# Patient Record
Sex: Female | Born: 1985 | Race: Black or African American | Hispanic: No | Marital: Married | Smoking: Never smoker
Health system: Southern US, Community
[De-identification: ages and names within clinical notes are randomized; demographics above are authoritative.]

## PROBLEM LIST (undated history)

## (undated) DIAGNOSIS — K219 Gastro-esophageal reflux disease without esophagitis: Secondary | ICD-10-CM

## (undated) DIAGNOSIS — G43909 Migraine, unspecified, not intractable, without status migrainosus: Secondary | ICD-10-CM

## (undated) DIAGNOSIS — N83201 Unspecified ovarian cyst, right side: Secondary | ICD-10-CM

## (undated) HISTORY — DX: Migraine, unspecified, not intractable, without status migrainosus: G43.909

## (undated) HISTORY — DX: Unspecified ovarian cyst, right side: N83.201

## (undated) HISTORY — DX: Gastro-esophageal reflux disease without esophagitis: K21.9

---

## 2014-07-07 ENCOUNTER — Ambulatory Visit: Admit: 2014-07-07 | Discharge: 2014-07-07 | Payer: Commercial Managed Care - PPO | Attending: MD

## 2014-07-07 DIAGNOSIS — R21 Rash and other nonspecific skin eruption: Secondary | ICD-10-CM

## 2014-07-07 MED ORDER — triamcinolone (KENALOG) 0.1 % cream
0.1 | Freq: Two times a day (BID) | TOPICAL | 0.00 refills | 30.00000 days | Status: AC
Start: 2014-07-07 — End: 2014-07-21

## 2014-07-07 NOTE — Progress Notes (Signed)
Regina Perez is a 29 y.o. female who presents with complaint of Establish Care      HISTORY OF PRESENT ILLNESS   HPI    Regina Perez is a pleasant 29 year old new patient to this clinic who presents to establish primary care. She has no chronic medical problems and is otherwise healthy.  She presents with complaints of abnormal rash. She first noticed this about 4 weeks ago. The rash is located on the back of her neck and it was noticed by her sister who was helping her dye her hair. The rash is brownish in color and feels rough. She denies any itching, redness, draining or pain. She has not had similar symptoms in the past and denies similar rash in other areas. She does not recall any contacts with similar rash or exposure to new detergents or soaps, insect bites, new medication or plants contacts. She has not tried any treatment for this condition to date.    [name] is not currently on any form of contraception. She said that in the past, she had been on Depo-Provera. This was about 4 years ago but she had to discontinue her birth control because it was not completely covered by her insurance at that time. She is not currently sexually active but has considered other forms of contraception. She has no known contraindications for birth control.    Regina Perez is single and works at the [employer]. She moved from [place] city, [address] to attend [employer] in [place]. She decided to remain here after she graduated and now and has a permanent job. She drinks alcohol rarely and is a nonsmoker.      Health Maintenance  Diabetes Mellitus: Recommended  Hyperlipidemia: Recommended   Pap Smear: Recommended  Mammogram: Not Applicable  Colorectal Cancer Screening: Not Applicable  Dexa Scan: Not Applicable  Influenza Vaccine: Not Applicable  Pneumococcal Polysaccharide Vaccine: Not Applicable  Zoster Vaccine: Not Applicable  Tetanus: Up to Date        No problems updated.  History reviewed.  No pertinent past medical history.  History reviewed. No pertinent past surgical history.  No Known Allergies  History     Social History Main Topics   . Smoking status: Passive Smoke Exposure - Never Smoker   . Smokeless tobacco: Never Used   . Alcohol Use: Yes      Comment: rarely   . Drug Use: No   . Sexual Activity: Not Currently     Birth Control/ Protection: None     Social History   . Marital Status: Single     Spouse Name: N/A   . Number of Children: N/A   . Years of Education: N/A     Other Topics Concern   . Caffeine Concern No   . Exercise Yes     gym       No results found for any previous visit.    REVIEW OF SYSTEMS   Review of Systems   Constitutional: Negative.    HENT: Negative.    Eyes: Negative.    Respiratory: Negative.    Cardiovascular: Negative.    Gastrointestinal: Negative.    Genitourinary: Negative.    Musculoskeletal: Negative.    Skin: Positive for rash.   Neurological: Negative.    Hematological: Negative.    Psychiatric/Behavioral: Negative.        PHYSICAL EXAM   BP 122/70 mmHg  Pulse 88  Temp(Src) 36.9 ?C (98.4 ?F) (Oral)  Ht 5' 8 (1.727  m)  Wt 274 lb 14.4 oz (124.694 kg)  BMI 41.81 kg/m2  SpO2 98%  Body mass index is 41.81 kg/(m^2).  No exam data present    Physical Exam   Constitutional: She is oriented to person, place, and time. She appears well-developed and well-nourished. No distress.   Cardiovascular: Normal rate.    Pulmonary/Chest: Effort normal. No respiratory distress.   Neurological: She is alert and oriented to person, place, and time.   Skin: She is not diaphoretic.   Rough, brown macules on back of neck   Psychiatric: She has a normal mood and affect. Her behavior is normal. Judgment and thought content normal.   Nursing note and vitals reviewed.        ASSESSMENT & PLAN   Bisma was seen today for establish care.    Diagnoses and all orders for this visit:    Localized macular rash: Differentials include contact dermatitis, tinea versicolor and seborrheic  dermatitis. Recommend trial of triamcinolone cream apply twice daily for 14 days.  Orders:  -     triamcinolone (KENALOG) 0.1 % cream; Apply topically 2 (two) times daily for 14 days.    Encounter for other general counseling or advice on contraception: Counseling and education provided today regarding the different forms of contraception. Risks and benefits of combined OCPs, intrauterine devices (ParaGard, [employer]) as well as intradermal implants like Nexplanon discussed. She was given opportunities to ask questions which were answered in detail. Educational materials were also provided to patient at today's visit.       Passive smoke exposure        This note was transcribed using speech recognition software. Please contact us for clarification if any questions arise relating to the wording of this document.      [facility]

## 2014-11-03 DIAGNOSIS — J029 Acute pharyngitis, unspecified: Secondary | ICD-10-CM

## 2014-11-03 NOTE — Discharge Instructions (Signed)
Increase oral fluid intake  Warm fluids, cold fluids, zinc lozenges, cepachol  Return for shortness of breath, inability to swallow, voice changes, new concerns  Tylenol, ibuprofen as directed  Primary care follow up in 3-5 days    Pharyngitis  Pharyngitis is redness, pain, and swelling (inflammation) of your pharynx.   CAUSES   Pharyngitis is usually caused by infection. Most of the time, these infections are from viruses (viral) and are part of a cold. However, sometimes pharyngitis is caused by bacteria (bacterial). Pharyngitis can also be caused by allergies. Viral pharyngitis may be spread from person to person by coughing, sneezing, and personal items or utensils (cups, forks, spoons, toothbrushes). Bacterial pharyngitis may be spread from person to person by more intimate contact, such as kissing.   SIGNS AND SYMPTOMS   Symptoms of pharyngitis include: ?  ? Sore throat. ?  ? Tiredness (fatigue). ?  ? Low-grade fever. ?  ? Headache.  ? Joint pain and muscle aches.  ? Skin rashes.  ? Swollen lymph nodes.  ? Plaque-like film on throat or tonsils (often seen with bacterial pharyngitis).  DIAGNOSIS   Your health care provider will ask you questions about your illness and your symptoms. Your medical history, along with a physical exam, is often all that is needed to diagnose pharyngitis. Sometimes, a rapid strep test is done. Other lab tests may also be done, depending on the suspected cause.   TREATMENT   Viral pharyngitis will usually get better in 3-4 days without the use of medicine. Bacterial pharyngitis is treated with medicines that kill germs (antibiotics).   HOME CARE INSTRUCTIONS   ? Drink enough water and fluids to keep your urine clear or pale yellow. ?  ? Only take over-the-counter or prescription medicines as directed by your health care provider: ?  ? If you are prescribed antibiotics, make sure you finish them even if you start to feel better. ?  ? Do not take aspirin. ?  ? Get lots of rest.  ?  ? Gargle with 8 oz of salt water (? tsp of salt per 1 qt of water) as often as every 1-2 hours to soothe your throat. ?  ? Throat lozenges (if you are not at risk for choking) or sprays may be used to soothe your throat.  SEEK MEDICAL CARE IF:   ? You have large, tender lumps in your neck.  ? You have a rash.  ? You cough up green, yellow-brown, or bloody spit.  SEEK IMMEDIATE MEDICAL CARE IF:   ? Your neck becomes stiff.  ? You drool or are unable to swallow liquids.  ? You vomit or are unable to keep medicines or liquids down.  ? You have severe pain that does not go away with the use of recommended medicines.  ? You have trouble breathing (not caused by a stuffy nose).  MAKE SURE YOU:   ? Understand these instructions.  ? Will watch your condition.  ? Will get help right away if you are not doing well or get worse.  Document Released: 02/20/2005 Document Revised: 12/11/2012 Document Reviewed: 10/28/2012  ExitCare? Patient Information ?2015 [employer]. This information is not intended to replace advice given to you by your health care provider. Make sure you discuss any questions you have with your health care provider.    Salt Water Gargle  This solution will help make your mouth and throat feel better.  HOME CARE INSTRUCTIONS   ? Mix 1 teaspoon  of salt in 8 ounces of warm water.  ? Gargle with this solution as much or often as you need or as directed. Swish and gargle gently if you have any sores or wounds in your mouth.  ? Do not swallow this mixture.  Document Released: 11/25/2003 Document Revised: 05/15/2011 Document Reviewed: 04/17/2008  ExitCare? Patient Information ?2015 [employer]. This information is not intended to replace advice given to you by your health care provider. Make sure you discuss any questions you have with your health care provider.

## 2014-11-03 NOTE — ED Provider Notes (Signed)
I saw this patient in my role as the ED provider at triage.  As such I did limited history and physical exam to confirm the patient's triage level and order appropriate studies (if any) to begin the patient's workup. The patient understands that a complete evaluation will be done when they are brought back to an ED room.      Chief complaint: sore throat since Sunday, ear pain bilaterally  Denies fevers    Pertinent findings: none    Plan: rapid strep test      Tillie Rung, NP  11/03/14 1739

## 2014-11-03 NOTE — ED Provider Notes (Signed)
[facility] EMERGENCY DEPARTMENT ENCOUNTER    History and Physical     Name: Regina Perez  DOB: [birth year] 30 y.o.  MRN: [id number]  CSN: [id number]    HISTORY:     CC:   Otalgia and Sore Throat    Chief Complaint   Patient presents with   . Otalgia   . Sore Throat       HPI:   Regina Perez is a 29 y.o. female who presents to the ED for evaluation of sore throat that began on Sunday. She reports painful swallowing. No significant nasal congestion and drainage. No cough or abdominal pain or rash. No fever or body aches. Ear pain is actually not within the ear but just below it. Went to work today, no treatments tried. No ill contacts, no recent travel. She does report she was around children which is not a common occurrence.    PMH:   She  has no past medical history on file.    PSH:   She  has no past surgical history on file.     Medications:   Previous Medications    No medications on file       Allergies:   She has No Known Allergies.    Social History:    She  reports that she has been passively smoking.  She has never used smokeless tobacco. She  reports that she drinks alcohol. She  reports that she does not use illicit drugs.   She denies the possibility of pregnancy    Review of Systems:   As in HPI otherwise complete review of systems negative except for those listed above.      Physical Exam   VITAL SIGNS: Blood pressure 157/95, pulse 85, temperature 37 ?C (98.6 ?F), temperature source Oral, resp. rate 12, weight 117.935 kg (260 lb), last menstrual period 10/20/2014, SpO2 98 %.      Constitutional: No acute distress. Non-toxic appearance.    HEENT: Normocephalic. Atraumatic. Oropharynx is clear with moist mucous membranes. Pupils equal, extra occular movement intact. Posterior pharynx is injected. Uvula is midline and there is no pharyngeal asymmetry. No peritonsillar abscesses noted. No tonsillar exudates. Bilateral TMs gray with good landmark visualization. She does have tender anterior cervical  lymphadenopathy bilaterally.    Neck:  Supple    Chest: Lungs clear to auscultation without wheezes, rales or rhonchi.    Cardiovascular: Regular rate and rhythm without murmurs, rubs or gallops.    Abdomen: Bowel sounds normal, Soft, No tenderness, No masses, No pulsatile masses. No hepatosplenomegaly. No rigidity or guarding    Skin: Warm and dry. No erythema. No rash.    Extremities: Ambulatory    Neurologic: Alert & oriented    ED Course and Medical Decision Making:    Regina Perez presented to the ED for evaluation.  Nursing notes reviewed.  Based on her history and exam the differential diagnosis includes viral pharyngitis, strep pharyngitis, peritonsillar abscess, soft tissue abscess. There is no signs of cellulitis. Rapid strep is negative. There is no peritonsillar abscess visualized. Discussed symptomatically treatment, signs and symptoms which to return immediately to the ER. Recommend primary care follow-up in 3-5 days. She verbalizes understanding as home in no acute distress    Labs  Results for orders placed or performed during the hospital encounter of 11/03/14   Quick Strep Test (Group A) -STAT   Result Value Ref Range    Source THROAT     Comments to Micro NONE  Rapid Strep A Screen NEGATIVE FOR GROUP A BETA STREP BY RAPID SCREENING     Report Status PENDING        Labs Reviewed   RAPID STREP SCREEN       CLINICAL IMPRESSION:    SNOMED CT(R)   1. Pharyngitis  PHARYNGITIS         PLAN:  There are no discharge medications for this patient.    Marcell Barlow, MD  [address]  [address]  [place] [address]  [phone]    In 3 days          Elnora Morrison, NP  11/03/14 2330

## 2014-11-03 NOTE — ED Notes (Signed)
NP at bedside.

## 2014-11-03 NOTE — ED Notes (Addendum)
PT STATES SHE HAS THE 'WORSE SORE THROAT EVER.'  SINCE Sunday THE 8/28... 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 WORSE SINCE....  TROUBLE SWALLOWING TODAY.. NO FEVER, NO VOMITING.Marland Kitchen PAIN MOVNG TO BILATERAL EARS

## 2014-11-04 ENCOUNTER — Inpatient Hospital Stay
Admit: 2014-11-04 | Discharge: 2014-11-04 | Disposition: A | Discharge status: Home / Self Care | Payer: Commercial Managed Care - PPO | Attending: Family

## 2014-11-04 LAB — RAPID STREP SCREEN: Rapid Strep A Screen: NEGATIVE

## 2015-01-09 NOTE — ED Provider Notes (Signed)
Emergency Provider Note   01/09/2015       History   CC: Otalgia    HPI: Regina Perez is a 29 y.o. female who presents to the ED for evaluation of ear pain.  She has had severe left ear pain for the past 2 days, seen yesterday, started on oral amoxicillin, taking ibuprofen at home without relief.  Pain remains very severe, left ear, interfering with sleep.  No nausea, vomiting, fevers, or other systemic symptoms.    PMH:  No past medical history on file.  PSH:  No past surgical history on file.    Medications:   Previous Medications    AMOXICILLIN (AMOXIL) 500 MG CAPSULE    Take 1 capsule by mouth 2 times daily for 7 days.    BENZONATATE (TESSALON) 100 MG CAPSULE    Take 1 capsule by mouth 3 times daily as needed for Cough.    PSEUDOEPHEDRINE-APAP-DM (DAYQUIL PO)    Take  by mouth.     Allergies: She has No Known Allergies..     Social History: She  reports that she has never smoked. She does not have any smokeless tobacco history on file. She reports that she does not drink alcohol or use illicit drugs..     Review of Systems   Constitutional: Negative for fever and chills.   HENT: Positive for ear pain.    Respiratory: Negative for cough and shortness of breath.    Cardiovascular: Negative for chest pain, palpitations and leg swelling.   Gastrointestinal: Negative for nausea, vomiting and abdominal pain.   Musculoskeletal: Negative for back pain, neck pain and neck stiffness.   Skin: Negative for wound.   Neurological: Negative for dizziness, syncope and weakness.   Hematological: Does not bruise/bleed easily.   Psychiatric/Behavioral: Negative for confusion and dysphoric mood.       Physical Exam   Vital Signs: Temp: 37.7 ?C (99.9 ?F) Pulse: 94 Resp: 16 BP: (!) 152/94 mmHg SpO2: 98 %     Physical Exam   Constitutional: She is oriented to person, place, and time. She appears well-developed and well-nourished. No distress.   HENT:   Head: Normocephalic and atraumatic.   Right Ear: Tympanic membrane and  external ear normal. No swelling or tenderness.   Left Ear: External ear normal. There is swelling (of canal) and tenderness. Tympanic membrane is injected. Tympanic membrane is not perforated.   Nose: Nose normal.   Mouth/Throat: Oropharynx is clear and moist.   Neck: Normal range of motion.   Cardiovascular: Normal rate, regular rhythm and intact distal pulses.    Pulmonary/Chest: Effort normal. No respiratory distress.   Neurological: She is alert and oriented to person, place, and time.   Skin: Skin is warm and dry. No rash noted. She is not diaphoretic. No pallor.   Psychiatric: Her behavior is normal. Thought content normal.   Nursing note and vitals reviewed.      ED Course and Medical Decision Making   [name] presented to the Emergency Department for evaluation, and she was triaged to room ER02. I reviewed the nursing notes, and she was evaluated by me.      Patient presents for evaluation of ongoing ear pain.  Exam reveals otitis externa with no sign of perforation.  She'll be started on Cipro drops and will use topical lidocaine for relief.  No sign of systemic symptoms, she is otherwise well-appearing.  Follow-up primary care.    Impression:  1. Acute otitis externa  Irma Newness, MD  01/09/15 1020

## 2020-04-27 DIAGNOSIS — O0990 Supervision of high risk pregnancy, unspecified, unspecified trimester: Secondary | ICD-10-CM | POA: Insufficient documentation

## 2020-05-21 LAB — OB RESULTS CONSOLE RUBELLA ANTIBODY, IGM: Rubella: IMMUNE

## 2020-05-21 LAB — OB RESULTS CONSOLE RPR: RPR: NONREACTIVE

## 2020-05-21 LAB — OB RESULTS CONSOLE HIV ANTIBODY (ROUTINE TESTING): HIV: NONREACTIVE

## 2020-05-21 LAB — OB RESULTS CONSOLE VARICELLA ZOSTER ANTIBODY, IGG: Varicella: IMMUNE

## 2020-05-21 LAB — OB RESULTS CONSOLE HEPATITIS B SURFACE ANTIGEN: Hepatitis B Surface Ag: NEGATIVE

## 2020-06-14 ENCOUNTER — Other Ambulatory Visit: Payer: Self-pay

## 2020-06-14 ENCOUNTER — Ambulatory Visit: Payer: 59 | Attending: Obstetrics and Gynecology | Admitting: Physical Therapy

## 2020-06-14 ENCOUNTER — Encounter: Payer: Self-pay | Admitting: Physical Therapy

## 2020-06-14 DIAGNOSIS — R102 Pelvic and perineal pain: Secondary | ICD-10-CM | POA: Diagnosis present

## 2020-06-14 DIAGNOSIS — M62838 Other muscle spasm: Secondary | ICD-10-CM | POA: Diagnosis present

## 2020-06-14 DIAGNOSIS — R278 Other lack of coordination: Secondary | ICD-10-CM | POA: Diagnosis present

## 2020-06-14 NOTE — Therapy (Signed)
[facility] [address] [place], [place], [address] Phone: [phone]   Fax:  [phone]  Physical Therapy Evaluation  Patient Details  Name: Yvette Oliver MRN: [id number] Date of Birth: 1985/11/02 Referring Provider (PT): Hassan Buckler   Encounter Date: 06/14/2020   PT End of Session - 06/14/20 0924    Visit Number 1    Number of Visits 12    Date for PT Re-Evaluation 09/06/20    PT Start Time 0930    PT Stop Time 1015    PT Time Calculation (min) 45 min    Activity Tolerance Patient tolerated treatment well    Behavior During Therapy [facility] for tasks assessed/performed           Past Medical History:  Diagnosis Date  . GERD (gastroesophageal reflux disease)   . Migraines   . Right ovarian cyst     History reviewed. No pertinent surgical history.  There were no vitals filed for this visit.        [facility] PT Assessment - 06/14/20 3536      Assessment   Medical Diagnosis Vaginismus    Referring Provider (PT) McVey, Ronita Hipps Dominance Right    Next MD Visit 06/25/2020    Prior Therapy None for this dx      Balance Screen   Has the patient fallen in the past 6 months No            PELVIC HEALTH PHYSICAL THERAPY EVALUATION  SCREENING Red Flags: None Have you had any night sweats? Unexplained weight loss? Saddle anesthesia? Unexplained changes in bowel or bladder habits?  Precautions: [redacted] weeks pregnant  SUBJECTIVE  Chief Complaint: Patient notes issue with tolerating pelvic exams. Patient notes this does not translate to sexual dysfunction; patient does note some discomfort with initial penetration. Patient notes it is difficult to describe pelvic exam pain; patient notes the sensation is that of not stretching. She adds that she will also feel pain in inner thigh muscles and as if she cannot relax either the inner thigh muscles or the PFM.  Patient also notes lower back pain with increased  duration of standing (1-2 hours) with pain radiating to hips. Patient notes pain with increased duration of walking, but standing is worse. Patient also notes tight hamstrings.  Pertinent History:  Falls Negative.  Scoliosis Negative. Pulmonary disease/dysfunction Negative. Surgical history: Negative.    Obstetrical History: G1P0 Deliveries: n/a Tearing/Episiotomy: n/a Birthing position: n/a  Gynecological History: Hysterectomy: No  Endometriosis: Negative PCOS: Positive. Pelvic Organ Prolapse: Negative Last Menstrual Period:  Pain with exam: Yes Heaviness/pressure: Yes    Urinary History: Incontinence: Negative.  Fluid Intake: 3 x16.9 oz H20, rare caffeinated, occasional juices/sodas Nocturia: 0-1x/night Frequency of urination: every 2 hours; historically every 5-6 hours Toileting posture: feet tucked back, heels raised Pain with urination: Negative Difficulty initiating urination: Negative Intermittent stream: Positive Frequent UTI: Negative.   Gastrointestinal History: [place] Stool Chart: Type 3-4 Frequency of BMs: 1x/day Pain with defecation: Negative Straining with defecation: Positive for during pregnancy. Hemorrhoids: Negative  Toileting posture: educated on elevating feet to improve PFM relaxation  Sexual activity/pain: Pain with intercourse: Positive.   Initial penetration: Yes  Deep thrusting: No External stimulation: No Able to achieve orgasm: Yes  Location of pain: vaginal canal Current pain:  0/10  Max pain:  8/10 Least pain:  0/10 Pain quality: pain quality: discomfort; burning; stretching; pressure Radiating pain: No    Patient assessment of  present state: "I have pelvic discomfort specifically during pelvic exams"  Current activities:  Relaxing, watching tv, family time, being in nature; walking  Patient Goals:  "where they can do their job without me crying"; "prepare for vaginal delivery"  Patient perception of overall  health: Good  OBJECTIVE  Mental Status Patient is oriented to person, place and time.  Recent memory is intact.  Remote memory is intact.  Attention span and concentration are intact.  Expressive speech is intact.  Patient's fund of knowledge is within normal limits for educational level.  POSTURE/OBSERVATIONS:  Lumbar lordosis: WNL Thoracic kyphosis: WNL Iliac crest height: equal bilaterally  GAIT: Increased gluteal tension throughout gait cycle. Overall WFL.  RANGE OF MOTION: deferred 2/2 to time constraints   LEFT RIGHT  Lumbar forward flexion (65):      Lumbar extension (30):     Lumbar lateral flexion (25):     Thoracic and Lumbar rotation (30 degrees):       Hip Flexion (0-125):      Hip IR (0-45):     Hip ER (0-45):     Hip Abduction (0-40):     Hip extension (0-15):       STRENGTH: MMT deferred 2/2 to time constraints  RLE LLE  Hip Flexion    Hip Extension    Hip Abduction     Hip Adduction     Hip ER     Hip IR     Knee Extension    Knee Flexion    Dorsiflexion     Plantarflexion (seated)     ABDOMINAL: deferred 2/2 to time constraints Palpation: Diastasis: Scar mobility: Rib flare:  SPECIAL TESTS: deferred 2/2 to time constraints  PHYSICAL PERFORMANCE MEASURES: STS: WNL   EXTERNAL PELVIC EXAM: deferred 2/2 to time constraints Palpation: Breath coordination: Cued Lengthen: Cued Contraction: Cough:  INTERNAL VAGINAL EXAM: not indicated 2/2 pregnancy Introitus Appears:  Skin integrity:  Scar mobility: Strength (PERF):  Symmetry: Palpation: Prolapse:   INTERNAL RECTAL EXAM: not indicated Strength (PERF): Symmetry: Palpation: Prolapse:   OUTCOME MEASURES: FOTO (PFDI 8)   ASSESSMENT Patient is a 35 year old presenting to clinic with chief complaints of pain with pelvic exam. Upon examination, patient demonstrates deficits in PFM extensibility, pain, hamstring and adductor tightness, PFM coordination as evidenced by  intermittent stream with urination, discomfort and tension in adductors during hooklying position, 8/10 pain with pelvic exam, pain with initial penetration during intercourse. Patient's responses on FOTO PFDI Pain outcome measures (8) indicate minimal-moderate functional limitations/disability/distress. Patient's progress may be limited due to persistence of complaint and pregnancy state; however, patient's motivation and health literacy are advantageous. Patient was able to achieve basic understanding of PFM function during today's evaluation and responded positively to educational interventions. Patient will benefit from continued skilled therapeutic intervention to address deficits in  PFM extensibility, pain, hamstring and adductor tightness, PFM coordination in order to increase function and improve overall QOL.  EDUCATION Patient educated on prognosis, POC, and provided with HEP including: bladder diary. Patient articulated understanding and returned demonstration. Patient will benefit from further education in order to maximize compliance and understanding for long-term therapeutic gains.  TREATMENT Neuromuscular Re-education: Patient educated on primary functions of the pelvic floor including: posture/balance, sexual pleasure, storage and elimination of waste from the body, abdominal cavity closure, and breath coordination.   Objective measurements completed on examination: See above findings.        PT Long Term Goals - 06/14/20 1027  PT LONG TERM GOAL #1   Title Patient will demonstrate independence with HEP in order to maximize therapeutic gains and improve carryover from physical therapy sessions to ADLs in the home and community.    Baseline IE: not initiated    Time 12    Period Weeks    Status New    Target Date 09/06/20      PT LONG TERM GOAL #2   Title Patient will demonstrate independent and coordinated diaphragmatic breathing in supine with a 1:2 breathing pattern  for improved down-regulation of the nervous system and improved management of intra-abdominal pressures in order to increase function at home and in the community.    Baseline IE: not demonstrated    Time 12    Period Weeks    Status New    Target Date 09/06/20      PT LONG TERM GOAL #3   Title Patient will decrease worst pain as reported on NPRS by at least 2 points to demonstrate clinically significant reduction in pain in order to restore/improve function and overall QOL.    Baseline IE: 8/10 (pelvic exam)    Time 12    Period Weeks    Status New    Target Date 09/06/20      PT LONG TERM GOAL #4   Title Patient will demonstrate coordinated lengthening and relaxation of PFM with diaphragmatic inhalation in order to decrease spasm and allow for unrestricted elimination of urine/feces for improved overall QOL.    Baseline IE: not demonstrated (intermittent stream present)    Time 12    Period Weeks    Status New    Target Date 09/06/20                  Plan - 06/14/20 0925    Clinical Impression Statement Patient is a 35 year old presenting to clinic with chief complaints of pain with pelvic exam. Upon examination, patient demonstrates deficits in PFM extensibility, pain, hamstring and adductor tightness, PFM coordination as evidenced by intermittent stream with urination, discomfort and tension in adductors during hooklying position, 8/10 pain with pelvic exam, pain with initial penetration during intercourse. Patient's responses on FOTO PFDI Pain outcome measures (8) indicate minimal-mderate functional limitations/disability/distress. Patient's progress may be limited due to persistence of complaint and pregnancy state; however, patient's motivation and health literacy are advantageous. Patient was able to achieve basic understanding of PFM function during today's evaluation and responded positively to educational interventions. Patient will benefit from continued skilled  therapeutic intervention to address deficits in  PFM extensibility, pain, hamstring and adductor tightness, PFM coordination in order to increase function and improve overall QOL.    Personal Factors and Comorbidities Age;Behavior Pattern;Comorbidity 3+;Past/Current Experience;Time since onset of injury/illness/exacerbation    Comorbidities migraines, vaginismus, right ovarian cysts, GERD    Examination-Activity Limitations Other    Examination-Participation Restrictions Interpersonal Relationship    Stability/Clinical Decision Making Evolving/Moderate complexity    Clinical Decision Making Moderate    Rehab Potential Fair    PT Frequency 1x / week    PT Duration 12 weeks    PT Treatment/Interventions ADLs/Self Care Home Management;Moist Heat;Cryotherapy;Biofeedback;Therapeutic exercise;Neuromuscular re-education;Patient/family education;Orthotic Fit/Training;Manual techniques;Taping;Spinal Manipulations;Joint Manipulations    PT Next Visit Plan physical assessment    PT Home Exercise Plan not initiated    Consulted and Agree with Plan of Care Patient           Patient will benefit from skilled therapeutic intervention in order to improve the following deficits  and impairments:  Pain,Postural dysfunction,Improper body mechanics,Increased muscle spasms,Increased fascial restricitons,Decreased coordination,Decreased activity tolerance  Visit Diagnosis: Pelvic pain - Plan: PT plan of care cert/re-cert  Other muscle spasm - Plan: PT plan of care cert/re-cert  Other lack of coordination - Plan: PT plan of care cert/re-cert     Problem List There are no problems to display for this patient.  Myles Gip PT, DPT [phone]  06/14/2020, 10:32 AM   [facility] [address] [place], [place], [address] Phone: [phone]   Fax:  [phone]  Name: Rosalie Buenaventura MRN: [id number] Date of Birth: Feb 12, 1986

## 2020-06-21 ENCOUNTER — Ambulatory Visit: Payer: 59 | Admitting: Physical Therapy

## 2020-06-28 ENCOUNTER — Encounter: Payer: Self-pay | Admitting: Physical Therapy

## 2020-06-28 ENCOUNTER — Ambulatory Visit: Payer: 59 | Admitting: Physical Therapy

## 2020-06-28 ENCOUNTER — Other Ambulatory Visit: Payer: Self-pay

## 2020-06-28 DIAGNOSIS — R102 Pelvic and perineal pain: Secondary | ICD-10-CM | POA: Diagnosis not present

## 2020-06-28 DIAGNOSIS — M62838 Other muscle spasm: Secondary | ICD-10-CM

## 2020-06-28 DIAGNOSIS — R278 Other lack of coordination: Secondary | ICD-10-CM

## 2020-06-28 NOTE — Therapy (Signed)
[facility] [address] [place], [place], [address] Phone: [phone]   Fax:  [phone]  Physical Therapy Treatment  Patient Details  Name: Yvette Oliver MRN: [id number] Date of Birth: [birth year] Referring Provider (PT): Hassan Buckler   Encounter Date: 06/28/2020   PT End of Session - 06/28/20 0939    Visit Number 2    Number of Visits 12    Date for PT Re-Evaluation 09/06/20    PT Start Time 0930    PT Stop Time 1015    PT Time Calculation (min) 45 min    Activity Tolerance Patient tolerated treatment well    Behavior During Therapy [facility] for tasks assessed/performed           Past Medical History:  Diagnosis Date  . GERD (gastroesophageal reflux disease)   . Migraines   . Right ovarian cyst     History reviewed. No pertinent surgical history.  There were no vitals filed for this visit.   Subjective Assessment - 06/28/20 0935    Subjective Patient presents without bladder diary but has made some observations. She reports that while she does not have a true stop to her stream she does have changes to flow from weak to strong. Patient adds that she is has noted during waking hours she pees every 4 hours. However, in the last week, she has noted 2-3x nocturia.    Currently in Pain? No/denies          TREATMENT Neuromuscular Re-education: Patient educated extensively on typical bladder function, typical bladder habits, basics of urge suppression, bladder irritants, toileting posture, and bladder retraining in order to better regulate bladder through behavioral changes.    Patient educated throughout session on appropriate technique and form using multi-modal cueing, HEP, and activity modification. Patient articulated understanding and returned demonstration.  Patient Response to interventions: Comfortable to return in 1 week.  ASSESSMENT Patient presents to clinic with excellent motivation to participate in  therapy. Patient demonstrates deficits in PFM extensibility, pain, hamstring and adductor tightness, PFM coordination. Patient able to achieve good understanding of urge suppression strategies during today's session and responded positively to educational interventions. Patient will benefit from continued skilled therapeutic intervention to address remaining deficits in PFM extensibility, pain, hamstring and adductor tightness, PFM coordination in order to increase function and improve overall QOL.    PT Long Term Goals - 06/14/20 1027      PT LONG TERM GOAL #1   Title Patient will demonstrate independence with HEP in order to maximize therapeutic gains and improve carryover from physical therapy sessions to ADLs in the home and community.    Baseline IE: not initiated    Time 12    Period Weeks    Status New    Target Date 09/06/20      PT LONG TERM GOAL #2   Title Patient will demonstrate independent and coordinated diaphragmatic breathing in supine with a 1:2 breathing pattern for improved down-regulation of the nervous system and improved management of intra-abdominal pressures in order to increase function at home and in the community.    Baseline IE: not demonstrated    Time 12    Period Weeks    Status New    Target Date 09/06/20      PT LONG TERM GOAL #3   Title Patient will decrease worst pain as reported on NPRS by at least 2 points to demonstrate clinically significant reduction in pain in order to restore/improve function  and overall QOL.    Baseline IE: 8/10 (pelvic exam)    Time 12    Period Weeks    Status New    Target Date 09/06/20      PT LONG TERM GOAL #4   Title Patient will demonstrate coordinated lengthening and relaxation of PFM with diaphragmatic inhalation in order to decrease spasm and allow for unrestricted elimination of urine/feces for improved overall QOL.    Baseline IE: not demonstrated (intermittent stream present)    Time 12    Period Weeks     Status New    Target Date 09/06/20                 Plan - 06/28/20 0941    Clinical Impression Statement Patient presents to clinic with excellent motivation to participate in therapy. Patient demonstrates deficits in PFM extensibility, pain, hamstring and adductor tightness, PFM coordination. Patient able to achieve good understanding of urge suppression strategies during today's session and responded positively to educational interventions. Patient will benefit from continued skilled therapeutic intervention to address remaining deficits in PFM extensibility, pain, hamstring and adductor tightness, PFM coordination in order to increase function and improve overall QOL.    Personal Factors and Comorbidities Age;Behavior Pattern;Comorbidity 3+;Past/Current Experience;Time since onset of injury/illness/exacerbation    Comorbidities migraines, vaginismus, right ovarian cysts, GERD    Examination-Activity Limitations Other    Examination-Participation Restrictions Interpersonal Relationship    Stability/Clinical Decision Making Evolving/Moderate complexity    Rehab Potential Fair    PT Frequency 1x / week    PT Duration 12 weeks    PT Treatment/Interventions ADLs/Self Care Home Management;Moist Heat;Cryotherapy;Biofeedback;Therapeutic exercise;Neuromuscular re-education;Patient/family education;Orthotic Fit/Training;Manual techniques;Taping;Spinal Manipulations;Joint Manipulations    PT Next Visit Plan physical assessment    PT Home Exercise Plan not initiated    Consulted and Agree with Plan of Care Patient           Patient will benefit from skilled therapeutic intervention in order to improve the following deficits and impairments:  Pain,Postural dysfunction,Improper body mechanics,Increased muscle spasms,Increased fascial restricitons,Decreased coordination,Decreased activity tolerance  Visit Diagnosis: Pelvic pain  Other muscle spasm  Other lack of  coordination     Problem List There are no problems to display for this patient.  Myles Gip PT, DPT [phone]  06/28/2020, 10:40 AM  [place] [facility] [address] [place], [place], [address] Phone: [phone]   Fax:  [phone]  Name: Yvette Oliver MRN: [id number] Date of Birth: 04-28-1985

## 2020-06-30 ENCOUNTER — Encounter: Payer: Self-pay | Admitting: Emergency Medicine

## 2020-06-30 ENCOUNTER — Emergency Department
Admission: EM | Admit: 2020-06-30 | Discharge: 2020-06-30 | Disposition: A | Discharge status: Home / Self Care | Payer: 59 | Attending: Emergency Medicine | Admitting: Emergency Medicine

## 2020-06-30 ENCOUNTER — Emergency Department: Payer: 59

## 2020-06-30 ENCOUNTER — Other Ambulatory Visit: Payer: Self-pay

## 2020-06-30 DIAGNOSIS — Z3A18 18 weeks gestation of pregnancy: Secondary | ICD-10-CM | POA: Insufficient documentation

## 2020-06-30 DIAGNOSIS — D259 Leiomyoma of uterus, unspecified: Secondary | ICD-10-CM

## 2020-06-30 DIAGNOSIS — O3412 Maternal care for benign tumor of corpus uteri, second trimester: Secondary | ICD-10-CM | POA: Insufficient documentation

## 2020-06-30 DIAGNOSIS — R109 Unspecified abdominal pain: Secondary | ICD-10-CM

## 2020-06-30 DIAGNOSIS — O26892 Other specified pregnancy related conditions, second trimester: Secondary | ICD-10-CM

## 2020-06-30 DIAGNOSIS — K219 Gastro-esophageal reflux disease without esophagitis: Secondary | ICD-10-CM | POA: Diagnosis not present

## 2020-06-30 LAB — COMPREHENSIVE METABOLIC PANEL
ALT: 87 U/L — ABNORMAL HIGH (ref 0–44)
AST: 53 U/L — ABNORMAL HIGH (ref 15–41)
Albumin: 3.3 g/dL — ABNORMAL LOW (ref 3.5–5.0)
Alkaline Phosphatase: 86 U/L (ref 38–126)
Anion gap: 8 (ref 5–15)
BUN: 6 mg/dL (ref 6–20)
CO2: 20 mmol/L — ABNORMAL LOW (ref 22–32)
Calcium: 9.5 mg/dL (ref 8.9–10.3)
Chloride: 109 mmol/L (ref 98–111)
Creatinine, Ser: 0.47 mg/dL (ref 0.44–1.00)
GFR, Estimated: >60 mL/min (ref >60)
Glucose, Bld: 97 mg/dL (ref 70–99)
Potassium: 4 mmol/L (ref 3.5–5.1)
Sodium: 137 mmol/L (ref 135–145)
Total Bilirubin: 0.3 mg/dL (ref 0.3–1.2)
Total Protein: 7.6 g/dL (ref 6.5–8.1)

## 2020-06-30 LAB — URINALYSIS, COMPLETE (UACMP) WITH MICROSCOPIC
Bilirubin Urine: NEGATIVE
Glucose, UA: NEGATIVE mg/dL
Ketones, ur: NEGATIVE mg/dL
Nitrite: NEGATIVE
Protein, ur: NEGATIVE mg/dL
Specific Gravity, Urine: 1.004 — ABNORMAL LOW (ref 1.005–1.030)
pH: 9 — ABNORMAL HIGH (ref 5.0–8.0)

## 2020-06-30 LAB — CBC WITH DIFFERENTIAL/PLATELET
Abs Immature Granulocytes: 0.1 10*3/uL — ABNORMAL HIGH (ref 0.00–0.07)
Basophils Absolute: 0 10*3/uL (ref 0.0–0.1)
Basophils Relative: 0 %
Eosinophils Absolute: 0.1 10*3/uL (ref 0.0–0.5)
Eosinophils Relative: 1 %
HCT: 34.2 % — ABNORMAL LOW (ref 36.0–46.0)
Hemoglobin: 11.2 g/dL — ABNORMAL LOW (ref 12.0–15.0)
Immature Granulocytes: 1 %
Lymphocytes Relative: 18 %
Lymphs Abs: 2.4 10*3/uL (ref 0.7–4.0)
MCH: 28.8 pg (ref 26.0–34.0)
MCHC: 32.7 g/dL (ref 30.0–36.0)
MCV: 87.9 fL (ref 80.0–100.0)
Monocytes Absolute: 1 10*3/uL (ref 0.1–1.0)
Monocytes Relative: 8 %
Neutro Abs: 9.8 10*3/uL — ABNORMAL HIGH (ref 1.7–7.7)
Neutrophils Relative %: 72 %
Platelets: 371 10*3/uL (ref 150–400)
RBC: 3.89 MIL/uL (ref 3.87–5.11)
RDW: 13.4 % (ref 11.5–15.5)
WBC: 13.4 10*3/uL — ABNORMAL HIGH (ref 4.0–10.5)
nRBC: 0 % (ref 0.0–0.2)

## 2020-06-30 LAB — LIPASE, BLOOD: Lipase: 49 U/L (ref 11–51)

## 2020-06-30 MED ORDER — ONDANSETRON 4 MG PO TBDP
4.0000 mg | ORAL_TABLET | Freq: Once | ORAL | Status: AC
  Administered 2020-06-30: 4 mg via ORAL
  Filled 2020-06-30: qty 1

## 2020-06-30 NOTE — ED Notes (Signed)
Pt discharged by other nurse. Room is empty.

## 2020-06-30 NOTE — ED Provider Notes (Signed)
[facility] Emergency Department Provider Note   ____________________________________________   Event Date/Time   First MD Initiated Contact with Patient 06/30/20 1024     (approximate)  I have reviewed the triage vital signs and the nursing notes.   HISTORY  Chief Complaint Abdominal Pain    HPI Yvette Oliver is a 35 y.o. female, G1P0 at approximately 18 weeks of pregnancy, who presents to the ED complaining of abdominal pain.  Patient reports that she has been dealing with about 1 week of intermittent pain in her lower abdomen.  She states it feels like a vibration just above her pubic symphysis that has seemed to worsen over the past 8 hours.  She denies any associated vaginal bleeding or discharge, has not had any dysuria or hematuria.  She denies any changes in her bowel movements and she has not had any nausea or vomiting.  She has not had any complications with this pregnancy thus far, she follows at the [facility].        Past Medical History:  Diagnosis Date  . GERD (gastroesophageal reflux disease)   . Migraines   . Right ovarian cyst     There are no problems to display for this patient.   History reviewed. No pertinent surgical history.  Prior to Admission medications   Medication Sig Start Date End Date Taking? Authorizing Provider  cholecalciferol (VITAMIN D3) 25 MCG (1000 UNIT) tablet Take 1,000 Units by mouth daily.    [provider]  Prenatal Vit-Fe Fumarate-FA (PRENATAL MULTIVITAMIN) TABS tablet Take 1 tablet by mouth daily at 12 noon.    [provider]    Allergies Patient has no known allergies.  History reviewed. No pertinent family history.  Social History    Review of Systems  Constitutional: No fever/chills Eyes: No visual changes. ENT: No sore throat. Cardiovascular: Denies chest pain. Respiratory: Denies shortness of breath. Gastrointestinal: Positive for abdominal pain.  No  nausea, no vomiting.  No diarrhea.  No constipation. Genitourinary: Negative for dysuria. Musculoskeletal: Negative for back pain. Skin: Negative for rash. Neurological: Negative for headaches, focal weakness or numbness.  ____________________________________________   PHYSICAL EXAM:  VITAL SIGNS: ED Triage Vitals  Enc Vitals Group     BP 06/30/20 1020 (!) 123/93     Pulse Rate 06/30/20 1020 (!) 117     Resp 06/30/20 1020 20     Temp 06/30/20 1019 98.3 F (36.8 C)     Temp Source 06/30/20 1019 Oral     SpO2 06/30/20 1020 100 %     Weight 06/30/20 1019 199 lb (90.3 kg)     Height 06/30/20 1019 5\' 3"  (1.6 m)     Head Circumference --      Peak Flow --      Pain Score 06/30/20 1018 7     Pain Loc --      Pain Edu? --      Excl. in [place]? --     Constitutional: Alert and oriented. Eyes: Conjunctivae are normal. Head: Atraumatic. Nose: No congestion/rhinnorhea. Mouth/Throat: Mucous membranes are moist. Neck: Normal ROM Cardiovascular: Normal rate, regular rhythm. Grossly normal heart sounds. Respiratory: Normal respiratory effort.  No retractions. Lungs CTAB. Gastrointestinal: Gravid abdomen, soft and nontender. No distention. Genitourinary: deferred Musculoskeletal: No lower extremity tenderness nor edema. Neurologic:  Normal speech and language. No gross focal neurologic deficits are appreciated. Skin:  Skin is warm, dry and intact. No rash noted. Psychiatric: Mood and affect are normal. Speech  and behavior are normal.  ____________________________________________   LABS (all labs ordered are listed, but only abnormal results are displayed)  Labs Reviewed  CBC WITH DIFFERENTIAL/PLATELET - Abnormal; Notable for the following components:      Result Value   WBC 13.4 (*)    Hemoglobin 11.2 (*)    HCT 34.2 (*)    Neutro Abs 9.8 (*)    Abs Immature Granulocytes 0.10 (*)    All other components within normal limits  COMPREHENSIVE METABOLIC PANEL - Abnormal; Notable for  the following components:   CO2 20 (*)    Albumin 3.3 (*)    AST 53 (*)    ALT 87 (*)    All other components within normal limits  URINALYSIS, COMPLETE (UACMP) WITH MICROSCOPIC - Abnormal; Notable for the following components:   Color, Urine STRAW (*)    APPearance CLEAR (*)    Specific Gravity, Urine 1.004 (*)    pH 9.0 (*)    Hgb urine dipstick SMALL (*)    Leukocytes,Ua TRACE (*)    Bacteria, UA MANY (*)    All other components within normal limits  LIPASE, BLOOD     PROCEDURES  Procedure(s) performed (including Critical Care):  Procedures   ____________________________________________   INITIAL IMPRESSION / ASSESSMENT AND PLAN / ED COURSE       35 year old female, G1, P0 at approximately 18 weeks of pregnancy, presents to the ED complaining of a "vibrating" pain to her central lower abdomen worsening over the past week.  Pain seems to have eased off at the moment and she has no tenderness on exam.  I doubt appendicitis, diverticulitis, or other infectious process.  Labs are reassuring and UA shows no signs of infection.  Ultrasound does show uterine fibroids, which patient was previously aware of.  There is some question of bleeding in the area of a fibroid, which sits below her placenta, but patient has had no vaginal bleeding and I doubt placental abruption.  Pain remains minimal at this time and she is appropriate for discharge home with OB/GYN follow-up, was counseled to return to the ED for new worsening symptoms, patient agrees with plan.      ____________________________________________   FINAL CLINICAL IMPRESSION(S) / ED DIAGNOSES  Final diagnoses:  Abdominal pain during pregnancy in second trimester  Uterine leiomyoma, unspecified location     ED Discharge Orders    None       Note:  This document was prepared using Dragon voice recognition software and may include unintentional dictation errors.   Blake Divine, MD 06/30/20 [phone]

## 2020-06-30 NOTE — ED Triage Notes (Signed)
Pt to ED via POV with c/o lower abd pain that has been intermittent x 1 week with sudden worsening over the last 8 hrs, pt also c/o vibrating abd pain. Pt states is [redacted]w[redacted]d, is a G1, sees [facility] for OB. Pt denies bleeding/leaking of fluids.

## 2020-06-30 NOTE — ED Notes (Addendum)
See traige note.  Pt describes abdominal sensation not as pain, but as discomfort. Feels "pressure" in lower abdomen  And "vibrating feeling" deep in abdomen, unsure if whether this is FM or not. Discussed possibility of braxton hicks ctx.  States has been feeling nauseous and slightly weak only today. LBM this morning. Has been having round ligament pain.  EDP at bedside.

## 2020-07-05 ENCOUNTER — Ambulatory Visit: Payer: 59 | Admitting: Physical Therapy

## 2020-07-06 ENCOUNTER — Observation Stay: Payer: 59 | Admitting: Certified Registered Nurse Anesthetist

## 2020-07-06 ENCOUNTER — Encounter: Payer: Self-pay | Admitting: Emergency Medicine

## 2020-07-06 ENCOUNTER — Encounter
Admission: EM | Disposition: A | Discharge status: Home / Self Care | Payer: Self-pay | Source: Home / Self Care | Attending: Emergency Medicine

## 2020-07-06 ENCOUNTER — Observation Stay
Admission: EM | Admit: 2020-07-06 | Discharge: 2020-07-07 | Disposition: A | Discharge status: Home / Self Care | Payer: 59

## 2020-07-06 ENCOUNTER — Other Ambulatory Visit: Payer: Self-pay

## 2020-07-06 ENCOUNTER — Emergency Department: Payer: 59

## 2020-07-06 DIAGNOSIS — Z20822 Contact with and (suspected) exposure to covid-19: Secondary | ICD-10-CM | POA: Insufficient documentation

## 2020-07-06 DIAGNOSIS — Z3A18 18 weeks gestation of pregnancy: Secondary | ICD-10-CM | POA: Insufficient documentation

## 2020-07-06 DIAGNOSIS — O4690 Antepartum hemorrhage, unspecified, unspecified trimester: Secondary | ICD-10-CM

## 2020-07-06 DIAGNOSIS — N888 Other specified noninflammatory disorders of cervix uteri: Secondary | ICD-10-CM | POA: Diagnosis present

## 2020-07-06 DIAGNOSIS — O26852 Spotting complicating pregnancy, second trimester: Secondary | ICD-10-CM | POA: Diagnosis present

## 2020-07-06 DIAGNOSIS — O3432 Maternal care for cervical incompetence, second trimester: Principal | ICD-10-CM | POA: Insufficient documentation

## 2020-07-06 DIAGNOSIS — O3430 Maternal care for cervical incompetence, unspecified trimester: Secondary | ICD-10-CM | POA: Diagnosis not present

## 2020-07-06 HISTORY — PX: CERVICAL CERCLAGE: SHX1329

## 2020-07-06 LAB — WET PREP, GENITAL
Clue Cells Wet Prep HPF POC: NONE SEEN
Sperm: NONE SEEN
Trich, Wet Prep: NONE SEEN
Yeast Wet Prep HPF POC: NONE SEEN

## 2020-07-06 LAB — CBC WITH DIFFERENTIAL/PLATELET
Abs Immature Granulocytes: 0.08 10*3/uL — ABNORMAL HIGH (ref 0.00–0.07)
Basophils Absolute: 0 10*3/uL (ref 0.0–0.1)
Basophils Relative: 0 %
Eosinophils Absolute: 0.1 10*3/uL (ref 0.0–0.5)
Eosinophils Relative: 1 %
HCT: 35.6 % — ABNORMAL LOW (ref 36.0–46.0)
Hemoglobin: 11.7 g/dL — ABNORMAL LOW (ref 12.0–15.0)
Immature Granulocytes: 1 %
Lymphocytes Relative: 22 %
Lymphs Abs: 3.3 10*3/uL (ref 0.7–4.0)
MCH: 29 pg (ref 26.0–34.0)
MCHC: 32.9 g/dL (ref 30.0–36.0)
MCV: 88.3 fL (ref 80.0–100.0)
Monocytes Absolute: 1.1 10*3/uL — ABNORMAL HIGH (ref 0.1–1.0)
Monocytes Relative: 7 %
Neutro Abs: 10.2 10*3/uL — ABNORMAL HIGH (ref 1.7–7.7)
Neutrophils Relative %: 69 %
Platelets: 382 10*3/uL (ref 150–400)
RBC: 4.03 MIL/uL (ref 3.87–5.11)
RDW: 13.2 % (ref 11.5–15.5)
WBC: 14.8 10*3/uL — ABNORMAL HIGH (ref 4.0–10.5)
nRBC: 0 % (ref 0.0–0.2)

## 2020-07-06 LAB — COMPREHENSIVE METABOLIC PANEL
ALT: 62 U/L — ABNORMAL HIGH (ref 0–44)
AST: 32 U/L (ref 15–41)
Albumin: 3.4 g/dL — ABNORMAL LOW (ref 3.5–5.0)
Alkaline Phosphatase: 92 U/L (ref 38–126)
Anion gap: 9 (ref 5–15)
BUN: 9 mg/dL (ref 6–20)
CO2: 21 mmol/L — ABNORMAL LOW (ref 22–32)
Calcium: 9.6 mg/dL (ref 8.9–10.3)
Chloride: 106 mmol/L (ref 98–111)
Creatinine, Ser: 0.47 mg/dL (ref 0.44–1.00)
GFR, Estimated: >60 mL/min (ref >60)
Glucose, Bld: 95 mg/dL (ref 70–99)
Potassium: 3.8 mmol/L (ref 3.5–5.1)
Sodium: 136 mmol/L (ref 135–145)
Total Bilirubin: 0.4 mg/dL (ref 0.3–1.2)
Total Protein: 8.2 g/dL — ABNORMAL HIGH (ref 6.5–8.1)

## 2020-07-06 LAB — CHLAMYDIA/NGC RT PCR (ARMC ONLY)
Chlamydia Tr: NOT DETECTED
N gonorrhoeae: NOT DETECTED

## 2020-07-06 LAB — URINALYSIS, ROUTINE W REFLEX MICROSCOPIC
Bacteria, UA: NONE SEEN
Bilirubin Urine: NEGATIVE
Glucose, UA: NEGATIVE mg/dL
Ketones, ur: NEGATIVE mg/dL
Leukocytes,Ua: NEGATIVE
Nitrite: NEGATIVE
Protein, ur: NEGATIVE mg/dL
Specific Gravity, Urine: 1.005 (ref 1.005–1.030)
Squamous Epithelial / HPF: NONE SEEN (ref 0–5)
pH: 6 (ref 5.0–8.0)

## 2020-07-06 LAB — RESP PANEL BY RT-PCR (FLU A&B, COVID) ARPGX2
Influenza A by PCR: NEGATIVE
Influenza B by PCR: NEGATIVE
SARS Coronavirus 2 by RT PCR: NEGATIVE

## 2020-07-06 LAB — ABO/RH: ABO/RH(D): O POS

## 2020-07-06 SURGERY — CERCLAGE, CERVIX, VAGINAL APPROACH
Anesthesia: Spinal | Site: Cervix

## 2020-07-06 MED ORDER — DIPHENHYDRAMINE HCL 50 MG/ML IJ SOLN
INTRAMUSCULAR | Status: DC | PRN
  Administered 2020-07-06: 25 mg via INTRAVENOUS

## 2020-07-06 MED ORDER — PHENYLEPHRINE HCL (PRESSORS) 10 MG/ML IV SOLN
INTRAVENOUS | Status: DC | PRN
  Administered 2020-07-06: 100 ug via INTRAVENOUS

## 2020-07-06 MED ORDER — ACETAMINOPHEN 500 MG PO TABS: 1000.0000 mg | ORAL_TABLET | Freq: Four times a day (QID) | ORAL | Status: DC | PRN

## 2020-07-06 MED ORDER — CALCIUM CARBONATE ANTACID 500 MG PO CHEW: 2.0000 | CHEWABLE_TABLET | ORAL | Status: DC | PRN

## 2020-07-06 MED ORDER — BUPIVACAINE IN DEXTROSE 0.75-8.25 % IT SOLN
INTRATHECAL | Status: DC | PRN
  Administered 2020-07-06: 1 mL via INTRATHECAL

## 2020-07-06 MED ORDER — CEFAZOLIN SODIUM-DEXTROSE 2-3 GM-%(50ML) IV SOLR
INTRAVENOUS | Status: DC | PRN
  Administered 2020-07-06: 2 g via INTRAVENOUS

## 2020-07-06 MED ORDER — ONDANSETRON HCL 4 MG/2ML IJ SOLN
INTRAMUSCULAR | Status: AC
  Filled 2020-07-06: qty 2

## 2020-07-06 MED ORDER — ONDANSETRON HCL 4 MG/2ML IJ SOLN: 4.0000 mg | Freq: Once | INTRAMUSCULAR | Status: DC | PRN

## 2020-07-06 MED ORDER — FENTANYL CITRATE (PF) 100 MCG/2ML IJ SOLN: 25.0000 ug | INTRAMUSCULAR | Status: DC | PRN

## 2020-07-06 MED ORDER — SODIUM CHLORIDE 0.9 % IV BOLUS (SEPSIS)
1000.0000 mL | Freq: Once | INTRAVENOUS | Status: AC
  Administered 2020-07-06: 1000 mL via INTRAVENOUS

## 2020-07-06 MED ORDER — SODIUM CHLORIDE 0.9 % IV SOLN
INTRAVENOUS | Status: DC | PRN
  Administered 2020-07-06: 10 ug/min via INTRAVENOUS

## 2020-07-06 MED ORDER — PRENATAL MULTIVITAMIN CH
1.0000 | ORAL_TABLET | Freq: Every day | ORAL | Status: DC
Start: 2020-07-06 — End: 2020-07-07
  Administered 2020-07-06 – 2020-07-07 (×2): 1 via ORAL
  Filled 2020-07-06 (×3): qty 1

## 2020-07-06 MED ORDER — LACTATED RINGERS IV SOLN: INTRAVENOUS | Status: DC | PRN

## 2020-07-06 MED ORDER — ONDANSETRON HCL 4 MG PO TABS: 8.0000 mg | ORAL_TABLET | Freq: Three times a day (TID) | ORAL | Status: DC | PRN

## 2020-07-06 MED ORDER — ONDANSETRON HCL 4 MG/2ML IJ SOLN
INTRAMUSCULAR | Status: DC | PRN
  Administered 2020-07-06: 4 mg via INTRAVENOUS

## 2020-07-06 MED ORDER — ACETAMINOPHEN 500 MG PO TABS
1000.0000 mg | ORAL_TABLET | Freq: Four times a day (QID) | ORAL | Status: DC | PRN
Start: 2020-07-06 — End: 2020-07-07
  Administered 2020-07-06 – 2020-07-07 (×5): 1000 mg via ORAL
  Filled 2020-07-06 (×5): qty 2

## 2020-07-06 SURGICAL SUPPLY — 20 items
CANISTER SUCT 1200ML W/VALVE (MISCELLANEOUS) ×2 IMPLANT
CATH ROBINSON RED A/P 16FR (CATHETERS) ×2 IMPLANT
COVER WAND RF STERILE (DRAPES) ×2 IMPLANT
DRAPE UNDER BUTTOCK W/FLU (DRAPES) ×2 IMPLANT
ELECT REM PT RETURN 9FT ADLT (ELECTROSURGICAL) ×2
ELECTRODE REM PT RTRN 9FT ADLT (ELECTROSURGICAL) ×1 IMPLANT
GLOVE SURG SYN 8.0 (GLOVE) ×2 IMPLANT
GOWN STRL REUS W/ TWL LRG LVL3 (GOWN DISPOSABLE) ×1 IMPLANT
GOWN STRL REUS W/ TWL XL LVL3 (GOWN DISPOSABLE) ×1 IMPLANT
GOWN STRL REUS W/TWL LRG LVL3 (GOWN DISPOSABLE) ×1
GOWN STRL REUS W/TWL XL LVL3 (GOWN DISPOSABLE) ×1
KIT TURNOVER CYSTO (KITS) ×2 IMPLANT
LABEL OR SOLS (LABEL) ×2 IMPLANT
MANIFOLD NEPTUNE II (INSTRUMENTS) ×2 IMPLANT
NS IRRIG 500ML POUR BTL (IV SOLUTION) ×2 IMPLANT
PACK BASIN MINOR ARMC (MISCELLANEOUS) ×2 IMPLANT
PAD OB MATERNITY 4.3X12.25 (PERSONAL CARE ITEMS) ×2 IMPLANT
PAD PREP 24X41 OB/GYN DISP (PERSONAL CARE ITEMS) ×2 IMPLANT
SURGILUBE 2OZ TUBE FLIPTOP (MISCELLANEOUS) ×2 IMPLANT
TAPE MERSILENE 5MM 36 OS-8 WHT (SUTURE) ×2 IMPLANT

## 2020-07-06 NOTE — ED Triage Notes (Signed)
Patient ambulatory to triage with steady gait, without difficulty or distress noted; pt reports [redacted]wks pregnant with vag bleeding and lower abd "discomfort" tonight; G1, pt at [facility] with no complications; [facility] 9/15

## 2020-07-06 NOTE — Anesthesia Procedure Notes (Signed)
Spinal  Patient location during procedure: OR Start time: 07/06/2020 8:25 AM End time: 07/06/2020 8:30 AM Reason for block: surgical anesthesia Staffing Performed: resident/CRNA  Anesthesiologist: Gunnar Fusi, MD Resident/CRNA: Norm Salt, CRNA Preanesthetic Checklist Completed: patient identified, IV checked, site marked, risks and benefits discussed, surgical consent, monitors and equipment checked and pre-op evaluation Spinal Block Patient position: sitting Prep: DuraPrep Patient monitoring: heart rate, continuous pulse ox and blood pressure Approach: midline Location: L3-4 Injection technique: single-shot Needle Needle type: Pencan  Needle gauge: 24 G Needle length: 10 cm Assessment Events: CSF return

## 2020-07-06 NOTE — Brief Op Note (Signed)
07/06/2020  9:16 AM  PATIENT:  Yvette Oliver  35 y.o. 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   POST-OPERATIVE DIAGNOSIS:  Incompetent Cervix 18+[redacted] weeks ega PROCEDURE:  Procedure(s): CERCLAGE CERVICAL (N/A) Mcdonald -5 mm mersilene band SURGEON:  Surgeon(s) and Role:    * Doriana Mazurkiewicz, Gwen Her, MD - Primary  PHYSICIAN ASSISTANT: CSt  ASSISTANTS: none   ANESTHESIA:   spinal  EBL:  5 mL IOf 300 cc OU 100 cc BLOOD ADMINISTERED:none  DRAINS: none   LOCAL MEDICATIONS USED:  NONE  SPECIMEN:  No Specimen  DISPOSITION OF SPECIMEN:  N/A  COUNTS:  YES  TOURNIQUET:  * No tourniquets in log *  DICTATION: .Other Dictation: Dictation Number verbal  PLAN OF CARE: Admit to inpatient   PATIENT DISPOSITION:  PACU - hemodynamically stable.   Delay start of Pharmacological VTE agent (>24hrs) due to surgical blood loss or risk of bleeding: not applicable

## 2020-07-06 NOTE — Anesthesia Preprocedure Evaluation (Addendum)
Anesthesia Evaluation  Patient identified by MRN, date of birth, ID band Patient awake    Reviewed: Allergy & Precautions, H&P , NPO status , Patient's Chart, lab work & pertinent test results, reviewed documented beta blocker date and time   Airway Mallampati: II   Neck ROM: full    Dental  (+) Poor Dentition   Pulmonary neg pulmonary ROS,    Pulmonary exam normal        Cardiovascular negative cardio ROS Normal cardiovascular exam Rhythm:regular Rate:Normal     Neuro/Psych  Headaches, negative neurological ROS  negative psych ROS   GI/Hepatic negative GI ROS, Neg liver ROS, GERD  Medicated,  Endo/Other  Morbid obesity  Renal/GU negative Renal ROS  negative genitourinary   Musculoskeletal   Abdominal   Peds  Hematology negative hematology ROS (+)   Anesthesia Other Findings Past Medical History: No date: GERD (gastroesophageal reflux disease) No date: Migraines No date: Right ovarian cyst History reviewed. No pertinent surgical history. BMI    Body Mass Index: 35.43 kg/m     Reproductive/Obstetrics negative OB ROS                             Anesthesia Physical Anesthesia Plan  ASA: II and emergent  Anesthesia Plan: Spinal   Post-op Pain Management:    Induction:   PONV Risk Score and Plan: 2  Airway Management Planned:   Additional Equipment:   Intra-op Plan:   Post-operative Plan:   Informed Consent: I have reviewed the patients History and Physical, chart, labs and discussed the procedure including the risks, benefits and alternatives for the proposed anesthesia with the patient or authorized representative who has indicated his/her understanding and acceptance.     Dental Advisory Given  Plan Discussed with: CRNA  Anesthesia Plan Comments:         Anesthesia Quick Evaluation

## 2020-07-06 NOTE — ED Provider Notes (Signed)
[facility] Emergency Department Provider Note  ____________________________________________   Event Date/Time   First MD Initiated Contact with Patient 07/06/20 0206     (approximate)  I have reviewed the triage vital signs and the nursing notes.   HISTORY  Chief Complaint Vaginal Bleeding    HPI Yvette Oliver is a 35 y.o. female G1, P0 who is approximately 18 weeks and 4 days pregnant with estimated due date of September 30 who presents to the emergency department with concerns for vaginal bleeding tonight.  States that she went to the bathroom and when she wiped she noticed pink blood on the toilet paper.  No blood in the toilet, no passing clots or tissue.  She has had some intermittent lower abdominal discomfort that improves with changing position and is sharp in nature.  Also has felt a vibration in her abdomen.  States she thinks this is normal pregnancy pain.  No other abnormal vaginal discharge.  No fevers, vomiting or diarrhea, dysuria or hematuria.  States she does think she feels the baby move and has intermittent fluttering.  No contractions.  No leaking fluid.  States she called her OB on-call at [facility] clinic and they recommended she come to the emergency department for evaluation.  Husband reports that his brother and sister-in-law just recently lost a pregnancy at 18 weeks due to cervical incompetency.     Patient was seen in the emergency department on 06/30/2020 for abdominal pain.  Labs reassuring at that time.  OB ultrasound showed single viable intrauterine pregnancy, 5.3 cm anterior fibroid, 5.4 cm myometrial focus posterior to the placenta that was also likely a large fibroid but focal hemorrhage could not be excluded.     Past Medical History:  Diagnosis Date  . GERD (gastroesophageal reflux disease)   . Migraines   . Right ovarian cyst     There are no problems to display for this patient.   History reviewed. No pertinent  surgical history.  Prior to Admission medications   Medication Sig Start Date End Date Taking? Authorizing Provider  cholecalciferol (VITAMIN D3) 25 MCG (1000 UNIT) tablet Take 1,000 Units by mouth daily.    [provider]  Prenatal Vit-Fe Fumarate-FA (PRENATAL MULTIVITAMIN) TABS tablet Take 1 tablet by mouth daily at 12 noon.    [provider]    Allergies Patient has no known allergies.  No family history on file.  Social History Social History   Tobacco Use  . Smoking status: Never Smoker  . Smokeless tobacco: Never Used  Vaping Use  . Vaping Use: Never used    Review of Systems Constitutional: No fever. Eyes: No visual changes. ENT: No sore throat. Cardiovascular: Denies chest pain. Respiratory: Denies shortness of breath. Gastrointestinal: No nausea, vomiting, diarrhea. Genitourinary: Negative for dysuria. Musculoskeletal: Negative for back pain. Skin: Negative for rash. Neurological: Negative for focal weakness or numbness.  ____________________________________________   PHYSICAL EXAM:  VITAL SIGNS: ED Triage Vitals  Enc Vitals Group     BP 07/06/20 0217 (!) 132/93     Pulse Rate 07/06/20 0217 (!) 116     Resp 07/06/20 0217 18     Temp 07/06/20 0217 98.6 F (37 C)     Temp Source 07/06/20 0217 Oral     SpO2 07/06/20 0217 100 %     Weight 07/06/20 0201 200 lb (90.7 kg)     Height 07/06/20 0201 5\' 3"  (1.6 m)     Head Circumference --  Peak Flow --      Pain Score 07/06/20 0201 4     Pain Loc --      Pain Edu? --      Excl. in [place]? --    CONSTITUTIONAL: Alert and oriented and responds appropriately to questions.  Appears anxious.  Afebrile, nontoxic, well-hydrated. HEAD: Normocephalic EYES: Conjunctivae clear, pupils appear equal, EOM appear intact ENT: normal nose; moist mucous membranes NECK: Supple, normal ROM CARD: Regular and tachycardic; S1 and S2 appreciated; no murmurs, no clicks, no rubs, no gallops RESP: Normal chest  excursion without splinting or tachypnea; breath sounds clear and equal bilaterally; no wheezes, no rhonchi, no rales, no hypoxia or respiratory distress, speaking full sentences ABD/GI: Normal bowel sounds; gravid uterus; soft, non-tender, no rebound, no guarding, no peritoneal signs, no hepatosplenomegaly GU: patient defers BACK: The back appears normal EXT: Normal ROM in all joints; no deformity noted, no edema; no cyanosis SKIN: Normal color for age and race; warm; no rash on exposed skin NEURO: Moves all extremities equally PSYCH: The patient's mood and manner are appropriate.  ____________________________________________   LABS (all labs ordered are listed, but only abnormal results are displayed)  Labs Reviewed  WET PREP, GENITAL - Abnormal; Notable for the following components:      Result Value   WBC, Wet Prep HPF POC FEW (*)    All other components within normal limits  CBC WITH DIFFERENTIAL/PLATELET - Abnormal; Notable for the following components:   WBC 14.8 (*)    Hemoglobin 11.7 (*)    HCT 35.6 (*)    Neutro Abs 10.2 (*)    Monocytes Absolute 1.1 (*)    Abs Immature Granulocytes 0.08 (*)    All other components within normal limits  COMPREHENSIVE METABOLIC PANEL - Abnormal; Notable for the following components:   CO2 21 (*)    Total Protein 8.2 (*)    Albumin 3.4 (*)    ALT 62 (*)    All other components within normal limits  URINALYSIS, ROUTINE W REFLEX MICROSCOPIC - Abnormal; Notable for the following components:   Color, Urine STRAW (*)    APPearance CLEAR (*)    Hgb urine dipstick MODERATE (*)    All other components within normal limits  CHLAMYDIA/NGC RT PCR ([facility] ONLY)  RESP PANEL BY RT-PCR (FLU A&B, COVID) [id number]  ABO/RH   ____________________________________________  EKG   ____________________________________________  RADIOLOGY I, Lindley Hiney, personally viewed and evaluated these images (plain radiographs) as part of my medical  decision making, as well as reviewing the written report by the radiologist.  ED MD interpretation: Cervical funneling.  Official radiology report(s): US OB Limited > 14 wks  Result Date: 07/06/2020 CLINICAL DATA:  Vaginal bleeding, lower abdominal discomfort EXAM: LIMITED OBSTETRIC ULTRASOUND COMPARISON:  Ultrasound 06/30/2020 FINDINGS: Number of Fetuses: 1 Heart Rate:  140 bpm Movement: Yes Presentation: Transverse, cranial position leftward Placental Location: Posterior Previa: No Amniotic Fluid (Subjective):  Within normal limits. BPD: 0.4 0.3 cm cm 19 w  0 d MATERNAL FINDINGS: Cervix:  Cervical funneling noted. Uterus/Adnexae: Redemonstration of an anterior uterine fibroid measuring 4.4 x 3.4 x 5.5 cm. An additional subserosal fibroid measuring 3.7 x 3.0 x 4.6 cm is seen extending from the left uterine fundus. A heterogeneous retroplacental focus within the myometrium seen on comparison exam is no longer visible on today's examination. No other focal uterine abnormality. Normal appearance of the left ovary. Simple appearing 2.1 cm cyst in the right ovary. IMPRESSION: 1. Single intrauterine gestation  at 19 weeks, 0 days by BPD sonographic estimation. 2. A previously seen heterogeneous myometrial focus subjacent to the placenta is no longer evident on today's examination could reflect resolution of hemorrhage. 3. Mild cervical funneling is noted on the exam. Consider obstetrical consultation. 4. Multiple fibroids include an anterior 5.5 cm fibroid of the uterine body, and a 4.6 cm subserosal appearing fibroid at the left uterine fundus. 5. 2 cm simple appearing cyst in the right ovary. No followup imaging recommended. Note: This recommendation does not apply to premenarchal patients or to those with increased risk (genetic, family history, elevated tumor markers or other high-risk factors) of ovarian cancer. Reference: Radiology 2019 Nov; 293(2):359-371. This exam is performed on an emergent basis and does  not comprehensively evaluate fetal size, dating, or anatomy; follow-up complete OB US should be considered if further fetal assessment is warranted. Electronically Signed   By: Lovena Le M.D.   On: 07/06/2020 04:00    ____________________________________________   PROCEDURES  Procedure(s) performed (including Critical Care):  Procedures  ____________________________________________   INITIAL IMPRESSION / ASSESSMENT AND PLAN / ED COURSE  As part of my medical decision making, I reviewed the following data within the [place] History obtained from family, Nursing notes reviewed and incorporated, Labs reviewed , Old chart reviewed, Radiograph reviewed  and Notes from prior ED visits         Patient here with vaginal bleeding/spotting.  Reports 1 episode when she went to wipe tonight.  She appears very anxious and husband reports family members that recently lost a pregnancy at 37 weeks.  States she called her OB/GYN who recommended that she come in for further evaluation.  Abdominal exam currently is benign.  No signs of hemorrhaging.  She is tachycardic but I suspect this is due to anxiety.  Otherwise hemodynamically stable.  Doubt colitis, diverticulitis, appendicitis based on her exam.  I suspect that her abdominal pain is round ligament pain.  She declines any pain medicine at this time.  Will obtain labs, urinalysis and OB ultrasound given small amount of vaginal bleeding today.  Have offered pelvic exam which she declined stating she has a significant history of vaginismus and is unable to tolerate pelvic exams.  She would prefer to self swab for signs of infection today.  Low suspicion for STD given she is sexually active with husband only.  Review of her records shows that recent STD screening in March 2022 was negative.  ED PROGRESS  Patient's ultrasound shows mild cervical funneling.  Previous area of possible hemorrhage has resolved/no longer evident.  Normal  fetal heart tones and fetal movement seen on ultrasound.  Patient has not had any further bleeding.  Discussed case with Drinda Butts, CNM on-call for [facility] clinic.  She plans to admit patient at this time.  Would like her to be kept n.p.o. Patient may need a cervical cerclage in the morning.  Patient and husband have been updated with plan.  I reviewed all nursing notes and pertinent previous records as available.  I have reviewed and interpreted any EKGs, lab and urine results, imaging (as available).  ____________________________________________   FINAL CLINICAL IMPRESSION(S) / ED DIAGNOSES  Final diagnoses:  Vaginal bleeding in pregnancy  Cervical funneling     ED Discharge Orders    None      *Please note:  Mickey Auchter was evaluated in Emergency Department on 07/06/2020 for the symptoms described in the history of present illness. She was evaluated in the  context of the global COVID-19 pandemic, which necessitated consideration that the patient might be at risk for infection with the SARS-CoV-2 virus that causes COVID-19. Institutional protocols and algorithms that pertain to the evaluation of patients at risk for COVID-19 are in a state of rapid change based on information released by regulatory bodies including the CDC and federal and state organizations. These policies and algorithms were followed during the patient's care in the ED.  Some ED evaluations and interventions may be delayed as a result of limited staffing during and the pandemic.*   Note:  This document was prepared using Dragon voice recognition software and may include unintentional dictation errors.   Jemiah Cuadra, Delice Bison, DO 07/06/20 [phone]

## 2020-07-06 NOTE — Progress Notes (Signed)
Report received from Drinda Butts  Passive spotting this am at 0100. He has been followed for pressure with serial u/s - cervical lengths all have been > 3 cm until this u/s this am that showed funneling and cx length 7 mm  Pt denies active cramping or pain . Prior chlamydia infection 3/22 and both her and partner treated .  O: VSS abd : benign , no uterine TTP Pelvic deferred  A: incompetent cervix  P; spoke to the patient regarding expectant management which would most likely progress to fetal loss vs placement of cervical cerclage . Given cervical length is 7 mm there is a higher likelihood of ROM and infection and fetal loss.  Risks of blood transfusion discussed After thorough discussion the patient elects for Mcdonald cervical cerclage . Marland Kitchen

## 2020-07-06 NOTE — Transfer of Care (Signed)
Immediate Anesthesia Transfer of Care Note  Patient: Yvette Oliver  Procedure(s) Performed: CERCLAGE CERVICAL (N/A Cervix)  Patient Location: PACU  Anesthesia Type:Spinal  Level of Consciousness: awake, alert  and oriented  Airway & Oxygen Therapy: Patient Spontanous Breathing  Post-op Assessment: Report given to RN and Post -op Vital signs reviewed and stable  Post vital signs: Reviewed and stable  Last Vitals:  Vitals Value Taken Time  BP 124/80 07/06/20 0921  Temp    Pulse 92 07/06/20 0923  Resp 21 07/06/20 0923  SpO2 99 % 07/06/20 0923  Vitals shown include unvalidated device data.  Last Pain:  Vitals:   07/06/20 0742  TempSrc: Oral  PainSc:          Complications: No complications documented.

## 2020-07-06 NOTE — Progress Notes (Signed)
L&D nurse Roselyn Reef, came to check fetal heart rate in PACU. 148 bpm was the fetal heart rate

## 2020-07-06 NOTE — Progress Notes (Signed)
Patient transferred to OR via bed and orderly.

## 2020-07-06 NOTE — Anesthesia Procedure Notes (Signed)
Date/Time: 07/06/2020 8:44 AM Performed by: Lily Peer, Easton Sivertson, CRNA Pre-anesthesia Checklist: Patient identified, Emergency Drugs available, Suction available and Patient being monitored Patient Re-evaluated:Patient Re-evaluated prior to induction Oxygen Delivery Method: Simple face mask

## 2020-07-06 NOTE — Anesthesia Postprocedure Evaluation (Signed)
Anesthesia Post Note  Patient: Yvette Oliver  Procedure(s) Performed: CERCLAGE CERVICAL (N/A Cervix)  Patient location during evaluation: PACU Anesthesia Type: Spinal Level of consciousness: awake and alert Pain management: pain level controlled Vital Signs Assessment: post-procedure vital signs reviewed and stable Respiratory status: spontaneous breathing and respiratory function stable Cardiovascular status: stable Anesthetic complications: no   No complications documented.   Last Vitals:  Vitals:   07/06/20 1000 07/06/20 1015  BP: 114/80 121/84  Pulse: 95 93  Resp: (!) 26 (!) 25  Temp:    SpO2: 99% 97%    Last Pain:  Vitals:   07/06/20 1000  TempSrc:   PainSc: 0-No pain                 [name] K

## 2020-07-06 NOTE — OR Nursing (Signed)
Fetal monitor check by labor and delivery nurse. She reports HR of the baby is 145 BPM.

## 2020-07-06 NOTE — H&P (Signed)
HISTORY AND PHYSICAL NOTE  History of Present Illness: Yvette Oliver is a 35 y.o. G1P0 at [redacted]w[redacted]d admitted for observation for cervical insufficiency.  She presented to the ED with complaints of vaginal spotting.  She reports intermittent mucus discharge and occasional spotting.  Last night the spotting changed to bright red when she wiped.  She denies contractions or period-like cramping.  Denies recent intercourse.    [name] was evaluated in the ED and had an OB US performed.  Concern for significant funneling with short cervix of approximately 7.67mm.    Patient Active Problem List   Diagnosis Date Noted  . 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 GERD (gastroesophageal reflux disease)   . Migraines   . Right ovarian cyst     History reviewed. No pertinent surgical history.  OB History  Gravida Para Term Preterm AB Living  1            SAB IAB Ectopic Multiple Live Births               # Outcome Date GA Lbr Len/2nd Weight Sex Delivery Anes PTL Lv  1 Current             Social Hx:  reports that she has never smoked. She has never used smokeless tobacco.   No family history on file.  No Known Allergies  Medications Prior to Admission  Medication Sig Dispense Refill Last Dose  . doxylamine, Sleep, (UNISOM) 25 MG tablet Take by mouth at bedtime as needed (for nausea).   07/05/2020 at Unknown time  . omeprazole (PRILOSEC) 20 MG capsule Take 20 mg by mouth daily as needed.   07/05/2020 at Unknown time  . ondansetron (ZOFRAN) 4 MG tablet Take 4 mg by mouth every 8 (eight) hours as needed for nausea or vomiting.   07/05/2020 at Unknown time  . cholecalciferol (VITAMIN D3) 25 MCG (1000 UNIT) tablet Take 1,000 Units by mouth daily.     . Prenatal Vit-Fe Fumarate-FA (PRENATAL MULTIVITAMIN) TABS tablet Take 1 tablet by mouth daily at 12 noon.       Review of Systems  Constitutional: Negative for chills and fever.  Respiratory: Negative for cough.    Cardiovascular: Negative for chest pain and palpitations.  Gastrointestinal: Negative for abdominal pain, heartburn and nausea.  Genitourinary: Negative for dysuria, frequency and urgency.       + Vaginal spotting   Skin: Negative for rash.  Neurological: Negative for dizziness and headaches.  Psychiatric/Behavioral: The patient is nervous/anxious.     Vitals:  BP 137/86 (BP Location: Right Arm)   Pulse (!) 113   Temp 98.4 F (36.9 C) (Oral)   Resp 18   Ht 5\' 3"  (1.6 m)   Wt 90.7 kg   SpO2 100%   BMI 35.43 kg/m  Physical Examination: Physical Exam Constitutional:      Appearance: Normal appearance.  HENT:     Mouth/Throat:     Mouth: Mucous membranes are dry.  Cardiovascular:     Rate and Rhythm: Normal rate and regular rhythm.     Pulses: Normal pulses.     Heart sounds: Normal heart sounds.  Pulmonary:     Effort: Pulmonary effort is normal.     Breath sounds: Normal breath sounds.  Abdominal:     Palpations: Abdomen is soft.     Comments: Gravid, c/w [redacted] week gestation   Musculoskeletal:        General: Normal  range of motion.     Cervical back: Normal range of motion.  Skin:    General: Skin is warm and dry.     Capillary Refill: Capillary refill takes 2 to 3 seconds.  Neurological:     Mental Status: She is alert and oriented to person, place, and time.  Psychiatric:        Behavior: Behavior normal.     Comments: Mood appropriate for situation     Labs:  Results for orders placed or performed during the hospital encounter of 07/06/20 (from the past 24 hour(s))  Wet prep, genital   Collection Time: 07/06/20  2:29 AM   Specimen: Genital  Result Value Ref Range   Yeast Wet Prep HPF POC NONE SEEN NONE SEEN   Trich, Wet Prep NONE SEEN NONE SEEN   Clue Cells Wet Prep HPF POC NONE SEEN NONE SEEN   WBC, Wet Prep HPF POC FEW (A) NONE SEEN   Sperm NONE SEEN   Chlamydia/NGC rt PCR ([facility] only)   Collection Time: 07/06/20  2:29 AM  Result Value Ref Range    Specimen source GC/Chlam ENDOCERVICAL    Chlamydia Tr NOT DETECTED NOT DETECTED   N gonorrhoeae NOT DETECTED NOT DETECTED  CBC with Differential/Platelet   Collection Time: 07/06/20  2:29 AM  Result Value Ref Range   WBC 14.8 (H) 4.0 - 10.5 K/uL   RBC 4.03 3.87 - 5.11 MIL/uL   Hemoglobin 11.7 (L) 12.0 - 15.0 g/dL   HCT 35.6 (L) 36.0 - 46.0 %   MCV 88.3 80.0 - 100.0 fL   MCH 29.0 26.0 - 34.0 pg   MCHC 32.9 30.0 - 36.0 g/dL   RDW 13.2 11.5 - 15.5 %   Platelets 382 150 - 400 K/uL   nRBC 0.0 0.0 - 0.2 %   Neutrophils Relative % 69 %   Neutro Abs 10.2 (H) 1.7 - 7.7 K/uL   Lymphocytes Relative 22 %   Lymphs Abs 3.3 0.7 - 4.0 K/uL   Monocytes Relative 7 %   Monocytes Absolute 1.1 (H) 0.1 - 1.0 K/uL   Eosinophils Relative 1 %   Eosinophils Absolute 0.1 0.0 - 0.5 K/uL   Basophils Relative 0 %   Basophils Absolute 0.0 0.0 - 0.1 K/uL   Immature Granulocytes 1 %   Abs Immature Granulocytes 0.08 (H) 0.00 - 0.07 K/uL  Comprehensive metabolic panel   Collection Time: 07/06/20  2:29 AM  Result Value Ref Range   Sodium 136 135 - 145 mmol/L   Potassium 3.8 3.5 - 5.1 mmol/L   Chloride 106 98 - 111 mmol/L   CO2 21 (L) 22 - 32 mmol/L   Glucose, Bld 95 70 - 99 mg/dL   BUN 9 6 - 20 mg/dL   Creatinine, Ser 0.47 0.44 - 1.00 mg/dL   Calcium 9.6 8.9 - 10.3 mg/dL   Total Protein 8.2 (H) 6.5 - 8.1 g/dL   Albumin 3.4 (L) 3.5 - 5.0 g/dL   AST 32 15 - 41 U/L   ALT 62 (H) 0 - 44 U/L   Alkaline Phosphatase 92 38 - 126 U/L   Total Bilirubin 0.4 0.3 - 1.2 mg/dL   GFR, Estimated >60 >60 mL/min   Anion gap 9 5 - 15  Urinalysis, Routine w reflex microscopic   Collection Time: 07/06/20  2:29 AM  Result Value Ref Range   Color, Urine STRAW (A) YELLOW   APPearance CLEAR (A) CLEAR   Specific Gravity, Urine 1.005 1.005 -  1.030   pH 6.0 5.0 - 8.0   Glucose, UA NEGATIVE NEGATIVE mg/dL   Hgb urine dipstick MODERATE (A) NEGATIVE   Bilirubin Urine NEGATIVE NEGATIVE   Ketones, ur NEGATIVE NEGATIVE mg/dL    Protein, ur NEGATIVE NEGATIVE mg/dL   Nitrite NEGATIVE NEGATIVE   Leukocytes,Ua NEGATIVE NEGATIVE   RBC / HPF 0-5 0 - 5 RBC/hpf   WBC, UA 0-5 0 - 5 WBC/hpf   Bacteria, UA NONE SEEN NONE SEEN   Squamous Epithelial / LPF NONE SEEN 0 - 5   Mucus PRESENT   ABO/Rh   Collection Time: 07/06/20  2:29 AM  Result Value Ref Range   ABO/RH(D)      O POS Performed at [facility], [address], [place] [address]   Resp Panel by RT-PCR (Flu A&B, Covid) Nasopharyngeal Swab   Collection Time: 07/06/20  4:35 AM   Specimen: Nasopharyngeal Swab; Nasopharyngeal(NP) swabs in vial transport medium  Result Value Ref Range   SARS Coronavirus 2 by RT PCR NEGATIVE NEGATIVE   Influenza A by PCR NEGATIVE NEGATIVE   Influenza B by PCR NEGATIVE NEGATIVE    Imaging Studies: US OB Limited > 14 wks  Addendum Date: 07/06/2020   ADDENDUM REPORT: 07/06/2020 05:21 ADDENDUM: Marked funneling of the internal cervical os with reduction of the functional length of the cervix to approximately 7.3 mm. Addendum will be called to the care team by the Radiologist Assistant, and communication documented in the PACS or [employer]. Electronically Signed   By: Lovena Le M.D.   On: 07/06/2020 05:21   Result Date: 07/06/2020 CLINICAL DATA:  Vaginal bleeding, lower abdominal discomfort EXAM: LIMITED OBSTETRIC ULTRASOUND COMPARISON:  Ultrasound 06/30/2020 FINDINGS: Number of Fetuses: 1 Heart Rate:  140 bpm Movement: Yes Presentation: Transverse, cranial position leftward Placental Location: Posterior Previa: No Amniotic Fluid (Subjective):  Within normal limits. BPD: 0.4 0.3 cm cm 19 w  0 d MATERNAL FINDINGS: Cervix:  Cervical funneling noted. Uterus/Adnexae: Redemonstration of an anterior uterine fibroid measuring 4.4 x 3.4 x 5.5 cm. An additional subserosal fibroid measuring 3.7 x 3.0 x 4.6 cm is seen extending from the left uterine fundus. A heterogeneous retroplacental focus within the myometrium seen on  comparison exam is no longer visible on today's examination. No other focal uterine abnormality. Normal appearance of the left ovary. Simple appearing 2.1 cm cyst in the right ovary. IMPRESSION: 1. Single intrauterine gestation at 19 weeks, 0 days by BPD sonographic estimation. 2. A previously seen heterogeneous myometrial focus subjacent to the placenta is no longer evident on today's examination could reflect resolution of hemorrhage. 3. Mild cervical funneling is noted on the exam. Consider obstetrical consultation. 4. Multiple fibroids include an anterior 5.5 cm fibroid of the uterine body, and a 4.6 cm subserosal appearing fibroid at the left uterine fundus. 5. 2 cm simple appearing cyst in the right ovary. No followup imaging recommended. Note: This recommendation does not apply to premenarchal patients or to those with increased risk (genetic, family history, elevated tumor markers or other high-risk factors) of ovarian cancer. Reference: Radiology 2019 Nov; 293(2):359-371. This exam is performed on an emergent basis and does not comprehensively evaluate fetal size, dating, or anatomy; follow-up complete OB US should be considered if further fetal assessment is warranted. Electronically Signed: By: Lovena Le M.D. On: 07/06/2020 04:00   US OB Limited  Result Date: 06/30/2020 CLINICAL DATA:  Abdominal pain. EXAM: LIMITED OBSTETRIC ULTRASOUND COMPARISON:  No prior. FINDINGS: Number of Fetuses: 1  Heart Rate:  144 bpm Movement: Present Presentation: Breech Placental Location: Posterior Previa: No Amniotic Fluid (Subjective): Normal vertical pocket of 4.0 cm noted. BPD: 3.97 cm 18 w  0 d MATERNAL FINDINGS: Cervix:  Cervix appears closed and measures 3.1 cm in length. Uterus/Adnexae: 5.3 cm anterior fibroid noted. Prominent 5.4 cm myometrial focus noted posterior to the placenta. This is most likely a large fibroid. Follow-up exam suggested to demonstrate stability and to exclude a focal hemorrhage.  IMPRESSION: 1. Single viable intrauterine pregnancy at 18 weeks 0 days. Breech presentation. 2.  Prominent 5.3 cm anterior fibroid. 3. Prominent 5.4 cm myometrial focus posterior to the placenta, most likely a large fibroid. Follow-up exam suggested demonstrate stability and to exclude a focal hemorrhage. This exam is performed on an emergent basis and does not comprehensively evaluate fetal size, dating, or anatomy; follow-up complete OB US should be considered if further fetal assessment is warranted. Electronically Signed   By: Marcello Moores  Register   On: 06/30/2020 12:34    Assessment and Plan: Patient Active Problem List   Diagnosis Date Noted  . 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 Observation to Antepartum for cervical insufficiency  2. [place] today shows significant funneling of the internal cervical OS with a functional cervical length of 7.28mm 3. Reviewed [place] report with Dr. Ouida Sills.  He will evaluate Rubiana for possible cerclage. 4. NPO 5. Vital signs per unit routine  6. Doppler FHR q shift.   Drinda Butts, CNM Certified Nurse Midwife [facility]

## 2020-07-06 NOTE — Progress Notes (Signed)
Asked MD about fetal monitoring for patient. Dr. Ouida Sills says that patient is okay to wait to go back to room, to have fetal monitoring. Patient is stable and doing well in recovery, no vaginal drainage noted.

## 2020-07-06 NOTE — Progress Notes (Signed)
ANTEPARTUM PROGRESS NOTE  Yvette Oliver is a 35 y.o. G1P0 at [redacted]w[redacted]d with EDC of Estimated Date of Delivery: 12/03/20 who is admitted for cervical Insuffiency and s/p Cerclage placement by Dr Ouida Sills.   Length of Stay:  0 Days. Admitted 07/06/2020  Subjective: Patient reports small amounts of fetal movement.  She reports no cramping and no loss of fluid from the vagina. Continues to have very small amounts of light brown spotting.   Vitals:  BP 119/84   Pulse 93   Temp 97.6 F (36.4 C) (Oral)   Resp 20   Ht 5\' 3"  (1.6 m)   Wt 90.7 kg   SpO2 100%   BMI 35.43 kg/m   Physical Examination: CONSTITUTIONAL: Well-developed, well-nourished female in no acute distress.  HENT:  Normocephalic, atraumatic EYES: Conjunctivae and EOM are normal. Pupils are equal, round, and reactive to light. No scleral icterus.  NECK: Normal range of motion, supple, no masses SKIN: Skin is warm and dry. No rash noted. Not diaphoretic. No erythema. No pallor. [place]: Alert and oriented to person, place, and time.  PSYCHIATRIC: Normal mood and affect. Normal behavior. Normal judgment and thought content. CARDIOVASCULAR: Normal heart rate noted RESPIRATORY: Effort and breath sounds normal, no problems with respiration noted MUSCULOSKELETAL: Normal range of motion. No edema and no tenderness. 2+ distal pulses. ABDOMEN: Soft, nontender, nondistended, gravid.   Results for orders placed or performed during the hospital encounter of 07/06/20 (from the past 48 hour(s))  CBC with Differential/Platelet     Status: Abnormal   Collection Time: 07/06/20  2:29 AM  Result Value Ref Range   WBC 14.8 (H) 4.0 - 10.5 K/uL   RBC 4.03 3.87 - 5.11 MIL/uL   Hemoglobin 11.7 (L) 12.0 - 15.0 g/dL   HCT 35.6 (L) 36.0 - 46.0 %   MCV 88.3 80.0 - 100.0 fL   MCH 29.0 26.0 - 34.0 pg   MCHC 32.9 30.0 - 36.0 g/dL   RDW 13.2 11.5 - 15.5 %   Platelets 382 150 - 400 K/uL   nRBC 0.0 0.0 - 0.2 %   Neutrophils Relative % 69 %    Neutro Abs 10.2 (H) 1.7 - 7.7 K/uL   Lymphocytes Relative 22 %   Lymphs Abs 3.3 0.7 - 4.0 K/uL   Monocytes Relative 7 %   Monocytes Absolute 1.1 (H) 0.1 - 1.0 K/uL   Eosinophils Relative 1 %   Eosinophils Absolute 0.1 0.0 - 0.5 K/uL   Basophils Relative 0 %   Basophils Absolute 0.0 0.0 - 0.1 K/uL   Immature Granulocytes 1 %   Abs Immature Granulocytes 0.08 (H) 0.00 - 0.07 K/uL    Comment: Performed at [facility], [address] [place], [place] [address]  Comprehensive metabolic panel     Status: Abnormal   Collection Time: 07/06/20  2:29 AM  Result Value Ref Range   Sodium 136 135 - 145 mmol/L   Potassium 3.8 3.5 - 5.1 mmol/L   Chloride 106 98 - 111 mmol/L   CO2 21 (L) 22 - 32 mmol/L   Glucose, Bld 95 70 - 99 mg/dL    Comment: Glucose reference range applies only to samples taken after fasting for at least 8 hours.   BUN 9 6 - 20 mg/dL   Creatinine, Ser 0.47 0.44 - 1.00 mg/dL   Calcium 9.6 8.9 - 10.3 mg/dL   Total Protein 8.2 (H) 6.5 - 8.1 g/dL   Albumin 3.4 (L) 3.5 - 5.0 g/dL  AST 32 15 - 41 U/L   ALT 62 (H) 0 - 44 U/L   Alkaline Phosphatase 92 38 - 126 U/L   Total Bilirubin 0.4 0.3 - 1.2 mg/dL   GFR, Estimated >60 >60 mL/min    Comment: (NOTE) Calculated using the CKD-EPI Creatinine Equation (2021)    Anion gap 9 5 - 15    Comment: Performed at [facility], [address] [place], [place] [address]  Urinalysis, Routine w reflex microscopic     Status: Abnormal   Collection Time: 07/06/20  2:29 AM  Result Value Ref Range   Color, Urine STRAW (A) YELLOW   APPearance CLEAR (A) CLEAR   Specific Gravity, Urine 1.005 1.005 - 1.030   pH 6.0 5.0 - 8.0   Glucose, UA NEGATIVE NEGATIVE mg/dL   Hgb urine dipstick MODERATE (A) NEGATIVE   Bilirubin Urine NEGATIVE NEGATIVE   Ketones, ur NEGATIVE NEGATIVE mg/dL   Protein, ur NEGATIVE NEGATIVE mg/dL   Nitrite NEGATIVE NEGATIVE   Leukocytes,Ua NEGATIVE NEGATIVE   RBC / HPF 0-5 0 - 5 RBC/hpf   WBC, UA 0-5  0 - 5 WBC/hpf   Bacteria, UA NONE SEEN NONE SEEN   Squamous Epithelial / LPF NONE SEEN 0 - 5   Mucus PRESENT     Comment: Performed at [facility], [address] [place], [place] [address]  Wet prep, genital     Status: Abnormal   Collection Time: 07/06/20  2:29 AM   Specimen: Genital  Result Value Ref Range   Yeast Wet Prep HPF POC NONE SEEN NONE SEEN   Trich, Wet Prep NONE SEEN NONE SEEN   Clue Cells Wet Prep HPF POC NONE SEEN NONE SEEN   WBC, Wet Prep HPF POC FEW (A) NONE SEEN   Sperm NONE SEEN     Comment: Performed at [facility], [address], [place], [place] [address]  [place] rt PCR [facility] only)     Status: None   Collection Time: 07/06/20  2:29 AM  Result Value Ref Range   Specimen source GC/Chlam ENDOCERVICAL    Chlamydia Tr NOT DETECTED NOT DETECTED   N gonorrhoeae NOT DETECTED NOT DETECTED    Comment: (NOTE) This CT/NG assay has not been evaluated in patients with a history of  hysterectomy. Performed at [facility], [address] [place], [place] [address]   ABO/Rh     Status: None   Collection Time: 07/06/20  2:29 AM  Result Value Ref Range   ABO/RH(D)      O POS Performed at [facility], [address] [place], [place] [address]   Resp Panel by RT-PCR (Flu A&B, Covid) Nasopharyngeal Swab     Status: None   Collection Time: 07/06/20  4:35 AM   Specimen: Nasopharyngeal Swab; Nasopharyngeal(NP) swabs in vial transport medium  Result Value Ref Range   SARS Coronavirus 2 by RT PCR NEGATIVE NEGATIVE    Comment: (NOTE) SARS-CoV-2 target nucleic acids are NOT DETECTED.  The SARS-CoV-2 RNA is generally detectable in upper respiratory specimens during the acute phase of infection. The lowest concentration of SARS-CoV-2 viral copies this assay can detect is 138 copies/mL. A negative result does not preclude SARS-Cov-2 infection and should not be used as the sole basis for treatment or other patient management  decisions. A negative result may occur with  improper specimen collection/handling, submission of specimen other than nasopharyngeal swab, presence of viral mutation(s) within the areas targeted by this assay, and  inadequate number of viral copies(<138 copies/mL). A negative result must be combined with clinical observations, patient history, and epidemiological information. The expected result is Negative.  Fact Sheet for Patients:  EntrepreneurPulse.com.au  Fact Sheet for Healthcare Providers:  IncredibleEmployment.be  This test is no t yet approved or cleared by the [place] FDA and  has been authorized for detection and/or diagnosis of SARS-CoV-2 by FDA under an Emergency Use Authorization (EUA). This EUA will remain  in effect (meaning this test can be used) for the duration of the COVID-19 declaration under Section 564(b)(1) of the Act, 21 U.S.C.section 360bbb-3(b)(1), unless the authorization is terminated  or revoked sooner.       Influenza A by PCR NEGATIVE NEGATIVE   Influenza B by PCR NEGATIVE NEGATIVE    Comment: (NOTE) The Xpert Xpress SARS-CoV-2/FLU/RSV plus assay is intended as an aid in the diagnosis of influenza from Nasopharyngeal swab specimens and should not be used as a sole basis for treatment. Nasal washings and aspirates are unacceptable for Xpert Xpress SARS-CoV-2/FLU/RSV testing.  Fact Sheet for Patients: EntrepreneurPulse.com.au  Fact Sheet for Healthcare Providers: IncredibleEmployment.be  This test is not yet approved or cleared by the [place] FDA and has been authorized for detection and/or diagnosis of SARS-CoV-2 by FDA under an Emergency Use Authorization (EUA). This EUA will remain in effect (meaning this test can be used) for the duration of the COVID-19 declaration under Section 564(b)(1) of the Act, 21 U.S.C. section 360bbb-3(b)(1), unless the authorization  is terminated or revoked.  Performed at [facility], [place], [place] [address]     [place] [employer] > 14 wks  Addendum Date: 07/06/2020   ADDENDUM REPORT: 07/06/2020 05:21 ADDENDUM: Marked funneling of the internal cervical os with reduction of the functional length of the cervix to approximately 7.3 mm. Addendum will be called to the care team by the Radiologist Assistant, and communication documented in the PACS or [employer]. Electronically Signed   By: Lovena Le M.D.   On: 07/06/2020 05:21   Result Date: 07/06/2020 CLINICAL DATA:  Vaginal bleeding, lower abdominal discomfort EXAM: LIMITED OBSTETRIC ULTRASOUND COMPARISON:  Ultrasound 06/30/2020 FINDINGS: Number of Fetuses: 1 Heart Rate:  140 bpm Movement: Yes Presentation: Transverse, cranial position leftward Placental Location: Posterior Previa: No Amniotic Fluid (Subjective):  Within normal limits. BPD: 0.4 0.3 cm cm 19 w  0 d MATERNAL FINDINGS: Cervix:  Cervical funneling noted. Uterus/Adnexae: Redemonstration of an anterior uterine fibroid measuring 4.4 x 3.4 x 5.5 cm. An additional subserosal fibroid measuring 3.7 x 3.0 x 4.6 cm is seen extending from the left uterine fundus. A heterogeneous retroplacental focus within the myometrium seen on comparison exam is no longer visible on today's examination. No other focal uterine abnormality. Normal appearance of the left ovary. Simple appearing 2.1 cm cyst in the right ovary. IMPRESSION: 1. Single intrauterine gestation at 19 weeks, 0 days by BPD sonographic estimation. 2. A previously seen heterogeneous myometrial focus subjacent to the placenta is no longer evident on today's examination could reflect resolution of hemorrhage. 3. Mild cervical funneling is noted on the exam. Consider obstetrical consultation. 4. Multiple fibroids include an anterior 5.5 cm fibroid of the uterine body, and a 4.6 cm subserosal appearing fibroid at the left uterine fundus. 5. 2 cm  simple appearing cyst in the right ovary. No followup imaging recommended. Note: This recommendation does not apply to premenarchal patients or to those with increased risk (genetic, family history, elevated tumor markers or other  high-risk factors) of ovarian cancer. Reference: Radiology 2019 Nov; 293(2):359-371. This exam is performed on an emergent basis and does not comprehensively evaluate fetal size, dating, or anatomy; follow-up complete OB US should be considered if further fetal assessment is warranted. Electronically Signed: By: Lovena Le M.D. On: 07/06/2020 04:00    Current scheduled medications . prenatal multivitamin  1 tablet Oral Q1200    I have reviewed the patient's current medications.  ASSESSMENT: Patient Active Problem List   Diagnosis Date Noted  . 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  Continue routine antenatal care.   Francetta Found, CNM 07/06/2020  8:32 PM

## 2020-07-07 ENCOUNTER — Observation Stay: Payer: 59

## 2020-07-07 DIAGNOSIS — O3430 Maternal care for cervical incompetence, unspecified trimester: Secondary | ICD-10-CM | POA: Diagnosis not present

## 2020-07-07 NOTE — Progress Notes (Signed)
   07/07/20 1345  Clinical Encounter Type  Visited With Patient and family together  Visit Type Follow-up;Spiritual support  Referral From Nurse  Consult/Referral To Chaplain  Spiritual Encounters  Spiritual Needs Prayer;Emotional  Chaplain Autumn Patty provided emotional and spiritual support to Yvette Oliver and her husband. Pt stated she is ready to go home but she have to take it easy. Pt had a procedure done and she is doing well. Pt should be discharged today

## 2020-07-07 NOTE — Progress Notes (Signed)
Patient discharged.  Discharge instructions given and all questions answered.  Post-op appt scheduled for May 12th @ 2:00pm.  Patient transported by auxiliary.

## 2020-07-07 NOTE — Discharge Summary (Signed)
Patient ID: Yvette Oliver MRN: [id number] DOB/AGE: [birth year] 35 y.o.  Admit date: 07/06/2020 Discharge date: 07/07/2020  Admission Diagnoses: observation for cervical insufficiency  Discharge Diagnoses: Cerclage suture in place  Prenatal Procedures: McDonald cervical cerclage with 5 mm Mersilene band.  Consults: None  Significant Diagnostic Studies:  Results for orders placed or performed during the hospital encounter of 07/06/20 (from the past 168 hour(s))  Wet prep, genital   Collection Time: 07/06/20  2:29 AM   Specimen: Genital  Result Value Ref Range   Yeast Wet Prep HPF POC NONE SEEN NONE SEEN   Trich, Wet Prep NONE SEEN NONE SEEN   Clue Cells Wet Prep HPF POC NONE SEEN NONE SEEN   WBC, Wet Prep HPF POC FEW (A) NONE SEEN   Sperm NONE SEEN   Chlamydia/NGC rt PCR ([facility] only)   Collection Time: 07/06/20  2:29 AM  Result Value Ref Range   Specimen source GC/Chlam ENDOCERVICAL    Chlamydia Tr NOT DETECTED NOT DETECTED   N gonorrhoeae NOT DETECTED NOT DETECTED  CBC with Differential/Platelet   Collection Time: 07/06/20  2:29 AM  Result Value Ref Range   WBC 14.8 (H) 4.0 - 10.5 K/uL   RBC 4.03 3.87 - 5.11 MIL/uL   Hemoglobin 11.7 (L) 12.0 - 15.0 g/dL   HCT 35.6 (L) 36.0 - 46.0 %   MCV 88.3 80.0 - 100.0 fL   MCH 29.0 26.0 - 34.0 pg   MCHC 32.9 30.0 - 36.0 g/dL   RDW 13.2 11.5 - 15.5 %   Platelets 382 150 - 400 K/uL   nRBC 0.0 0.0 - 0.2 %   Neutrophils Relative % 69 %   Neutro Abs 10.2 (H) 1.7 - 7.7 K/uL   Lymphocytes Relative 22 %   Lymphs Abs 3.3 0.7 - 4.0 K/uL   Monocytes Relative 7 %   Monocytes Absolute 1.1 (H) 0.1 - 1.0 K/uL   Eosinophils Relative 1 %   Eosinophils Absolute 0.1 0.0 - 0.5 K/uL   Basophils Relative 0 %   Basophils Absolute 0.0 0.0 - 0.1 K/uL   Immature Granulocytes 1 %   Abs Immature Granulocytes 0.08 (H) 0.00 - 0.07 K/uL  Comprehensive metabolic panel   Collection Time: 07/06/20  2:29 AM  Result Value Ref Range   Sodium 136 135 - 145 mmol/L    Potassium 3.8 3.5 - 5.1 mmol/L   Chloride 106 98 - 111 mmol/L   CO2 21 (L) 22 - 32 mmol/L   Glucose, Bld 95 70 - 99 mg/dL   BUN 9 6 - 20 mg/dL   Creatinine, Ser 0.47 0.44 - 1.00 mg/dL   Calcium 9.6 8.9 - 10.3 mg/dL   Total Protein 8.2 (H) 6.5 - 8.1 g/dL   Albumin 3.4 (L) 3.5 - 5.0 g/dL   AST 32 15 - 41 U/L   ALT 62 (H) 0 - 44 U/L   Alkaline Phosphatase 92 38 - 126 U/L   Total Bilirubin 0.4 0.3 - 1.2 mg/dL   GFR, Estimated >60 >60 mL/min   Anion gap 9 5 - 15  Urinalysis, Routine w reflex microscopic   Collection Time: 07/06/20  2:29 AM  Result Value Ref Range   Color, Urine STRAW (A) YELLOW   APPearance CLEAR (A) CLEAR   Specific Gravity, Urine 1.005 1.005 - 1.030   pH 6.0 5.0 - 8.0   Glucose, UA NEGATIVE NEGATIVE mg/dL   Hgb urine dipstick MODERATE (A) NEGATIVE   Bilirubin Urine NEGATIVE NEGATIVE   Ketones,  ur NEGATIVE NEGATIVE mg/dL   Protein, ur NEGATIVE NEGATIVE mg/dL   Nitrite NEGATIVE NEGATIVE   Leukocytes,Ua NEGATIVE NEGATIVE   RBC / HPF 0-5 0 - 5 RBC/hpf   WBC, UA 0-5 0 - 5 WBC/hpf   Bacteria, UA NONE SEEN NONE SEEN   Squamous Epithelial / LPF NONE SEEN 0 - 5   Mucus PRESENT   ABO/Rh   Collection Time: 07/06/20  2:29 AM  Result Value Ref Range   ABO/RH(D)      O POS Performed at [facility], [address] [place], [place] [address]   Resp Panel by RT-PCR (Flu A&B, Covid) Nasopharyngeal Swab   Collection Time: 07/06/20  4:35 AM   Specimen: Nasopharyngeal Swab; Nasopharyngeal(NP) swabs in vial transport medium  Result Value Ref Range   SARS Coronavirus 2 by RT PCR NEGATIVE NEGATIVE   Influenza A by PCR NEGATIVE NEGATIVE   Influenza B by PCR NEGATIVE NEGATIVE    Treatments: none  Hospital Course:  This is a 35 y.o. G1P0 with IUP at [redacted]w[redacted]d admitted for vaginal spotting, noted to have a cervical length of 7.51mm.  A rescue cerclage was placed by Dr. Ouida Sills and she was kept over night for observation.  Ultrasound the morning of discharged  showed  FINDINGS: Number of Fetuses: 1 Heart Rate:  133 bpm Movement: Yes Presentation: Cephalic Placental Location: Posterior Previa: No Amniotic Fluid (Subjective):  Within normal limits. AFI: 11.3 cm BPD: 4.4 cm 19 w  2 d MATERNAL FINDINGS: Cervix:  Appears closed.  Measures 3.8 cm. Uterus/Adnexae: No abnormality visualized. IMPRESSION: 1. Single living intrauterine gestation with an estimated gestational age of [redacted] weeks and 2 days. 2. Cervix appears closed with a length of 3.8 cm. 3. Normal amniotic fluid with an AFI of 11.3 cm  She was deemed stable for discharge to home with outpatient follow up.  Discharge Physical Exam:  BP 115/82 (BP Location: Left Arm)   Pulse 94   Temp 97.8 F (36.6 C)   Resp 20   Ht 5\' 3"  (1.6 m)   Wt 90.7 kg   SpO2 100%   BMI 35.43 kg/m   General: NAD CV: RRR Pulm: CTABL, nl effort ABD: s/nd/nt, gravid DVT Evaluation: LE non-ttp, no evidence of DVT on exam.      Discharge Condition: Stable  Disposition: Discharge disposition: 01-Home or Self Care        Allergies as of 07/07/2020   No Known Allergies     Medication List    TAKE these medications   cholecalciferol 25 MCG (1000 UNIT) tablet Commonly known as: VITAMIN D3 Take 1,000 Units by mouth daily.   doxylamine (Sleep) 25 MG tablet Commonly known as: UNISOM Take by mouth at bedtime as needed (for nausea).   omeprazole 20 MG capsule Commonly known as: PRILOSEC Take 20 mg by mouth daily as needed.   ondansetron 4 MG tablet Commonly known as: ZOFRAN Take 4 mg by mouth every 8 (eight) hours as needed for nausea or vomiting.   prenatal multivitamin Tabs tablet Take 1 tablet by mouth daily at 12 noon.       Follow-up Information    Schermerhorn, Gwen Her, MD. Schedule an appointment as soon as possible for a visit in 1 week(s).   Specialty: Obstetrics and Gynecology Contact information: [address] [place]  [id number] [phone]               Signed:  Regina Eck 07/07/2020 3:13  PM   

## 2020-07-07 NOTE — Progress Notes (Signed)
Pt returned from ultrasound

## 2020-07-07 NOTE — Progress Notes (Signed)
Pt to ultrasound

## 2020-07-07 NOTE — Discharge Instructions (Signed)
Cervical Cerclage, Care After This sheet gives you information about how to care for yourself after your procedure. Your health care provider may also give you more specific instructions. If you have problems or questions, contact your health care provider. What can I expect after the procedure? After your procedure, it is common to have:  Cramping in your abdomen.  Mucus discharge from your vagina. This may last for several days.  Painful urination (dysuria).  Spotting, or small drops of blood coming from your vagina. Follow these instructions at home: Medicines  Take over-the-counter and prescription medicines only as told by your health care provider.  Ask your health care provider if the medicine prescribed to you requires you to avoid driving or using heavy machinery. General instructions  If you are told to go on bed rest, follow instructions from your health care provider. You may need to be on bed rest for up to 3 days.  Keep track of your vaginal discharge and watch for any changes. If you notice changes, tell your health care provider.  Avoid physical activities and exercise until your health care provider approves. Ask your health care provider what activities are safe for you.  Do not douche or have sex until your health care provider says it is okay to do so.  Keep all follow-up visits, including prenatal visits, as told by your health care provider. This is important. ? Prenatal visits are all the care that you receive before the birth of your baby. ? You may also need an ultrasound.  You may be asked to have weekly visits to have your cervix checked.   Contact a health care provider if you:  Have abnormal discharge from your vagina, such as clots.  Have a bad-smelling discharge from your vagina.  Develop a rash on your skin. This may look like redness and swelling.  Become light-headed or feel like you are going to faint.  Have abdominal pain that does not  get better with medicine.  Have nausea or vomiting that does not go away. Get help right away if you:  Have vaginal bleeding that is heavier or more frequent than spotting.  Are leaking fluid or your water breaks.  Have a fever or chills.  Faint.  Have uterine contractions. These may feel like: ? A back ache. ? Lower abdominal pain. ? Mild cramps, similar to menstrual cramps. ? Tightening or pressure in your abdomen.  Think that your baby is not moving as much as usual, or you cannot feel your baby move.  Have chest pain.  Have shortness of breath. Summary  After the procedure, it is common to have cramping, vaginal discharge, painful urination, and small drops of blood coming from your vagina.  If you are told to go on bed rest, follow instructions from your health care provider. You may need to be on bed rest for up to 3 days.  Keep track of your vaginal discharge and watch for any changes. If you notice changes, tell your health care provider.  Contact a health care provider if you have abnormal vaginal discharge, become light-headed, or have pain that cannot be controlled with medicines.  Get help right away if you have heavy vaginal bleeding, your water breaks, or you have uterine contractions. Also, get help right away if your baby is not moving as much as usual, or you have chest pain or shortness of breath. This information is not intended to replace advice given to you by your health care   provider. Make sure you discuss any questions you have with your health care provider. Document Revised: 04/11/2019 Document Reviewed: 10/16/2018 Elsevier Patient Education  2021 [employer]  

## 2020-07-07 NOTE — Op Note (Signed)
NAME: Yvette Oliver, Yvette Oliver MEDICAL RECORD NO: [id number] ACCOUNT NO: [id number] DATE OF BIRTH: [birth year] FACILITY: [facility] LOCATION: [facility] PHYSICIAN: Boykin Nearing, MD  Operative Report   DATE OF PROCEDURE: 07/06/2020  PREOPERATIVE DIAGNOSES:  1.  18 + 4 weeks estimated gestational age. 2.  Incompetent cervix.  POSTOPERATIVE DIAGNOSES:  1.  18 + 4 weeks estimated gestational age. 2.  Incompetent cervix.  PROCEDURE:  McDonald cervical cerclage with 5 mm Mersilene band.  ANESTHESIA:  Spinal.   SURGEON:  Boykin Nearing, MD  INDICATIONS:  A 35 year old gravida 1, para 0 patient at 72 + 4 weeks estimated gestational age, admitted to antepartum ward after developing vaginal spotting approximately 6 hours before presenting to the Emergency Department.  The patient has been  followed for pelvic pressure with serial cervical length ultrasounds, all of which had been normal until today when the ultrasound demonstrated membrane funneling and cervical length measuring 7 mm.  DESCRIPTION OF PROCEDURE:  After adequate spinal anesthesia, the patient was placed in the dorsal supine position with the legs in candy cane stirrups.  The patient's lower abdomen, perineum and vagina were prepped and draped in normal sterile fashion.   The patient did receive 2 grams of IV Ancef prior to commencement for surgical prophylaxis.  A 5 mm Mersilene band was placed starting at 12 o'clock, exiting at 2 o'clock; entering at 4 o'clock, exiting at 5 o'clock; entering at 7 o'clock, exiting at 8  o'clock; entering at 10 o'clock and exiting at 12 o'clock.  The band was then cinched firmly and multiple knots were placed at 12 o'clock.  There was no evidence of leakage of amniotic fluid.  Good application of band with suture placed approximately 1.5  cm proximal to the distal portion of the cervical lip.  Good hemostasis was noted.  There were no complications.  The patient tolerated the procedure  well.   PAA D: 07/06/2020 9:30:12 am T: 07/07/2020 12:41:00 am  JOB: [id number]/ [id number]

## 2020-07-08 ENCOUNTER — Ambulatory Visit: Payer: 59 | Admitting: Physical Therapy

## 2020-07-10 ENCOUNTER — Emergency Department
Admission: EM | Admit: 2020-07-10 | Discharge: 2020-07-11 | Disposition: A | Discharge status: Home / Self Care | Payer: 59 | Attending: Emergency Medicine | Admitting: Emergency Medicine

## 2020-07-10 ENCOUNTER — Emergency Department: Payer: 59

## 2020-07-10 ENCOUNTER — Other Ambulatory Visit: Payer: Self-pay

## 2020-07-10 DIAGNOSIS — Z3A19 19 weeks gestation of pregnancy: Secondary | ICD-10-CM | POA: Insufficient documentation

## 2020-07-10 DIAGNOSIS — K219 Gastro-esophageal reflux disease without esophagitis: Secondary | ICD-10-CM | POA: Insufficient documentation

## 2020-07-10 DIAGNOSIS — O26892 Other specified pregnancy related conditions, second trimester: Secondary | ICD-10-CM

## 2020-07-10 DIAGNOSIS — R109 Unspecified abdominal pain: Secondary | ICD-10-CM

## 2020-07-10 DIAGNOSIS — R1030 Lower abdominal pain, unspecified: Secondary | ICD-10-CM | POA: Insufficient documentation

## 2020-07-10 DIAGNOSIS — R102 Pelvic and perineal pain: Secondary | ICD-10-CM

## 2020-07-10 LAB — SAMPLE TO BLOOD BANK

## 2020-07-10 LAB — WET PREP, GENITAL
Clue Cells Wet Prep HPF POC: NONE SEEN
Sperm: NONE SEEN
Trich, Wet Prep: NONE SEEN
Yeast Wet Prep HPF POC: NONE SEEN

## 2020-07-10 MED ORDER — ACETAMINOPHEN 500 MG PO TABS
1000.0000 mg | ORAL_TABLET | Freq: Once | ORAL | Status: AC
  Administered 2020-07-10: 1000 mg via ORAL
  Filled 2020-07-10: qty 2

## 2020-07-10 MED ORDER — FAMOTIDINE 20 MG PO TABS
20.0000 mg | ORAL_TABLET | Freq: Once | ORAL | Status: AC
  Administered 2020-07-10: 20 mg via ORAL
  Filled 2020-07-10: qty 1

## 2020-07-10 NOTE — ED Provider Notes (Signed)
[facility] Emergency Department Provider Note   ____________________________________________   Event Date/Time   First MD Initiated Contact with Patient 07/10/20 1652     (approximate)  I have reviewed the triage vital signs and the nursing notes.   HISTORY  Chief Complaint Abdominal Pain    HPI Yvette Oliver is a 35 y.o. female, G1, P0 at approximately 19 weeks of pregnancy, who presents to the ED complaining of abdominal pain.  Patient reports that she had sudden onset severe squeezing lower abdominal pain about 20 minutes prior to arrival.  Pain moved down into her pelvic area but she denies any associated vaginal bleeding or discharge.  Pain lasted for about 5 or 10 minutes before significantly improving.  She now has some mild ongoing pain but the severe pain has resolved.  She was found to have cervical insufficiency earlier this week, had cerclage performed with Dr. Ouida Sills 4 days ago.  She reports some light vaginal spotting 2 days ago and had follow-up ultrasound that was unremarkable.  She had been doing well since then with no further vaginal bleeding.        Past Medical History:  Diagnosis Date  . GERD (gastroesophageal reflux disease)   . Migraines   . Right ovarian cyst     Patient Active Problem List   Diagnosis Date Noted  . Cervical cerclage suture present 07/07/2020  . 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 CERVICAL CERCLAGE N/A 07/06/2020   Procedure: CERCLAGE CERVICAL;  Surgeon: Ouida Sills Gwen Her, MD;  Location: [facility] ORS;  Service: Gynecology;  Laterality: N/A;    Prior to Admission medications   Medication Sig Start Date End Date Taking? Authorizing Provider  cholecalciferol (VITAMIN D3) 25 MCG (1000 UNIT) tablet Take 1,000 Units by mouth daily.    [provider]  doxylamine, Sleep, (UNISOM) 25 MG tablet Take by mouth at bedtime as needed (for nausea).     [provider]  omeprazole (PRILOSEC) 20 MG capsule Take 20 mg by mouth daily as needed.    [provider]  ondansetron (ZOFRAN) 4 MG tablet Take 4 mg by mouth every 8 (eight) hours as needed for nausea or vomiting.    [provider]  Prenatal Vit-Fe Fumarate-FA (PRENATAL MULTIVITAMIN) TABS tablet Take 1 tablet by mouth daily at 12 noon.    [provider]    Allergies Patient has no known allergies.  No family history on file.  Social History Social History   Tobacco Use  . Smoking status: Never Smoker  . Smokeless tobacco: Never Used  Vaping Use  . Vaping Use: Never used  Substance Use Topics  . Alcohol use: Never  . Drug use: Never    Review of Systems  Constitutional: No fever/chills Eyes: No visual changes. ENT: No sore throat. Cardiovascular: Denies chest pain. Respiratory: Denies shortness of breath. Gastrointestinal: Positive for abdominal pain.  No nausea, no vomiting.  No diarrhea.  No constipation. Genitourinary: Negative for dysuria. Musculoskeletal: Negative for back pain. Skin: Negative for rash. Neurological: Negative for headaches, focal weakness or numbness.  ____________________________________________   PHYSICAL EXAM:  VITAL SIGNS: ED Triage Vitals  Enc Vitals Group     BP      Pulse      Resp      Temp      Temp src      SpO2      Weight  Height      Head Circumference      Peak Flow      Pain Score      Pain Loc      Pain Edu?      Excl. in [place]?     Constitutional: Alert and oriented. Eyes: Conjunctivae are normal. Head: Atraumatic. Nose: No congestion/rhinnorhea. Mouth/Throat: Mucous membranes are moist. Neck: Normal ROM Cardiovascular: Normal rate, regular rhythm. Grossly normal heart sounds. Respiratory: Normal respiratory effort.  No retractions. Lungs CTAB. Gastrointestinal: Gravid abdomen, soft and mildly tender to palpation in suprapubic area. No distention. Genitourinary:  deferred Musculoskeletal: No lower extremity tenderness nor edema. Neurologic:  Normal speech and language. No gross focal neurologic deficits are appreciated. Skin:  Skin is warm, dry and intact. No rash noted. Psychiatric: Mood and affect are normal. Speech and behavior are normal.  ____________________________________________   LABS (all labs ordered are listed, but only abnormal results are displayed)  Labs Reviewed  WET PREP, GENITAL - Abnormal; Notable for the following components:      Result Value   WBC, Wet Prep HPF POC MODERATE (*)    All other components within normal limits  SAMPLE TO BLOOD BANK     PROCEDURES  Procedure(s) performed (including Critical Care):  Procedures   ____________________________________________   INITIAL IMPRESSION / ASSESSMENT AND PLAN / ED COURSE       35 year old female, G1, P0 at approximately 19 weeks of pregnancy, presents to the ED complaining of acute onset severe lower abdominal and pelvic pain.  Pain seems to be improving and patient has not had any bleeding or discharge.  Case discussed with Dr. Leafy Ro of OB/GYN, who recommends proceeding with obstetrical ultrasound to assess cervical length.  She will contact the midwife to evaluate the patient and check cerclage with pelvic exam.  Pelvic exam performed by nurse midwife and is reassuring, wet prep was also performed and unremarkable.  Patient has been cleared by OB/GYN and is appropriate for discharge home with follow-up later this week for repeat ultrasound.  Patient is comfortable with minimal pain at this time, was counseled to return to the ED for new worsening symptoms, patient agrees with plan.      ____________________________________________   FINAL CLINICAL IMPRESSION(S) / ED DIAGNOSES  Final diagnoses:  Abdominal pain during pregnancy in second trimester     ED Discharge Orders    None       Note:  This document was prepared using Dragon voice  recognition software and may include unintentional dictation errors.   Blake Divine, MD 07/10/20 2328

## 2020-07-10 NOTE — ED Triage Notes (Signed)
Patient arrived via EMS for abdominal pain that started today. Pt states she had her cervix tied on Tuesday because it was at 70mm. She states she has residual bleeding since and is c/o abdominal pain that's mid abdomen and lower radiating to right side of abdomen.

## 2020-07-12 ENCOUNTER — Encounter: Payer: 59 | Admitting: Physical Therapy

## 2020-07-26 ENCOUNTER — Ambulatory Visit: Payer: 59 | Admitting: Physical Therapy

## 2020-08-09 ENCOUNTER — Encounter: Payer: 59 | Admitting: Physical Therapy

## 2020-08-16 ENCOUNTER — Encounter: Payer: 59 | Admitting: Physical Therapy

## 2020-08-23 ENCOUNTER — Encounter: Payer: 59 | Admitting: Physical Therapy

## 2020-08-30 ENCOUNTER — Encounter: Payer: 59 | Admitting: Physical Therapy

## 2020-11-09 ENCOUNTER — Other Ambulatory Visit: Payer: Self-pay

## 2020-11-09 ENCOUNTER — Observation Stay
Admission: AD | Admit: 2020-11-09 | Discharge: 2020-11-09 | Disposition: A | Discharge status: Home / Self Care | Payer: 59 | Source: Ambulatory Visit | Attending: Obstetrics and Gynecology | Admitting: Obstetrics and Gynecology

## 2020-11-09 ENCOUNTER — Encounter: Payer: Self-pay | Admitting: Obstetrics and Gynecology

## 2020-11-09 DIAGNOSIS — O3433 Maternal care for cervical incompetence, third trimester: Secondary | ICD-10-CM | POA: Diagnosis present

## 2020-11-09 DIAGNOSIS — Z3A36 36 weeks gestation of pregnancy: Secondary | ICD-10-CM | POA: Insufficient documentation

## 2020-11-09 NOTE — OB Triage Note (Signed)
Patient Discharged home per provider. Pt educated about labor precautions and informed when to return to the ED for further evaluation. Pt instructed to keep all follow up appointments with her provider. AVS given to patient and RN answered all questions and patient has no further questions at this time. Pt discharged home in stable condition with significant other.

## 2020-11-09 NOTE — OB Triage Note (Signed)
Pt is a 35yo G1P0 at 64w4dthat presents from ED for scheduled NST and cerclage removal. Pt states positive FM, Denies LOF, VB. EFM Applied and assessing.

## 2020-11-09 NOTE — Procedures (Signed)
Yvette Oliver   PROCEDURE DATE: * No surgery found *  PREOPERATIVE DIAGNOSIS: Intrauterine pregnancy at [redacted]w[redacted]d history of cervical incompetence   POSTOPERATIVE DIAGNOSIS: The same PROCEDURE: Transvaginal McDonald Cervical Cerclage removal SURGEON:  BBenjaman Kindler INDICATIONS: 35y.o. G1P0 at 398w4dith history of cervical incompetence and emergency cerclage placement, here for cerclage removal. She is in the L&D unit due to a significant history of vaginismus with need for nitrous oxide for pain control.  The risks of surgery were discussed in detail with the patient including but not limited to: bleeding; infection which may require antibiotic therapy; injury to cervix, vagina other surrounding organs; risk of ruptured membranes and/or preterm delivery and other postoperative or anesthesia complications.  Written informed consent was obtained.    FINDINGS:  About 2 cm palpable cervical length in the vagina, closed cervix, suture knot visible anteriorly and removed without complication.  ANESTHESIA:  Nitrous oxide, patient delivered INTRAVENOUS FLUIDS: n/a  ml ESTIMATED BLOOD LOSS: 5 ml COMPLICATIONS: None immediate  PROCEDURE IN DETAIL:    Patient brought to L&D for nitrous administration. She had CMP and CBC drawn prior to procedure. Fetal monitoring reassuring with a reactive NST.   Nitrous oxide was self-administered. She was placed in the dorsal lithotomy position and all instruments in the room. She was gently talked into relaxation and verbally gave consent for the vaginal exam.  After two attempts at managing vaginismus with the nitrous, I was able to carefully place the speculum well enough to see the cervix. It was cleaned with Betadine and the knot visualized. The knot in the Mersilene tape was seen at 12 o'clock, and grasped with ring forceps.   The knot was easily snipped and removed without complication. The cervix was not visibly opened at the external os.  Pt tolerated  the procedure well. Will continue to monitor for contractions for 30 min and will discharge with precautions.

## 2020-11-09 NOTE — Progress Notes (Signed)
Cervical Cerclage removed by Dr. Leafy Ro MD. Pt will be monitored for 30 minutes then anticipate discharge home. Pt tolerated procedure very Well.

## 2020-11-10 LAB — OB RESULTS CONSOLE RPR: RPR: NONREACTIVE

## 2020-11-10 LAB — OB RESULTS CONSOLE HIV ANTIBODY (ROUTINE TESTING): HIV: NONREACTIVE

## 2020-11-10 LAB — OB RESULTS CONSOLE GBS: GBS: NEGATIVE

## 2020-11-17 NOTE — Discharge Summary (Signed)
Pt discharged home after cerclage removal.

## 2020-11-28 ENCOUNTER — Inpatient Hospital Stay
Admission: EM | Admit: 2020-11-28 | Discharge: 2020-11-30 | DRG: 787 | Disposition: A | Discharge status: Home / Self Care | Payer: 59 | Attending: Obstetrics and Gynecology | Admitting: Obstetrics and Gynecology

## 2020-11-28 ENCOUNTER — Inpatient Hospital Stay: Payer: 59 | Admitting: Anesthesiology

## 2020-11-28 ENCOUNTER — Encounter
Admission: EM | Disposition: A | Discharge status: Home / Self Care | Payer: Self-pay | Source: Home / Self Care | Attending: Obstetrics and Gynecology

## 2020-11-28 ENCOUNTER — Encounter: Payer: Self-pay | Admitting: Obstetrics and Gynecology

## 2020-11-28 ENCOUNTER — Other Ambulatory Visit: Payer: Self-pay

## 2020-11-28 DIAGNOSIS — O3413 Maternal care for benign tumor of corpus uteri, third trimester: Secondary | ICD-10-CM | POA: Diagnosis present

## 2020-11-28 DIAGNOSIS — Z3A39 39 weeks gestation of pregnancy: Secondary | ICD-10-CM | POA: Diagnosis not present

## 2020-11-28 DIAGNOSIS — Z23 Encounter for immunization: Secondary | ICD-10-CM | POA: Diagnosis not present

## 2020-11-28 DIAGNOSIS — Z20822 Contact with and (suspected) exposure to covid-19: Secondary | ICD-10-CM | POA: Diagnosis present

## 2020-11-28 DIAGNOSIS — O339 Maternal care for disproportion, unspecified: Secondary | ICD-10-CM | POA: Diagnosis present

## 2020-11-28 DIAGNOSIS — D259 Leiomyoma of uterus, unspecified: Secondary | ICD-10-CM | POA: Diagnosis present

## 2020-11-28 DIAGNOSIS — O1495 Unspecified pre-eclampsia, complicating the puerperium: Secondary | ICD-10-CM | POA: Diagnosis present

## 2020-11-28 DIAGNOSIS — O9081 Anemia of the puerperium: Secondary | ICD-10-CM | POA: Diagnosis not present

## 2020-11-28 DIAGNOSIS — Z3402 Encounter for supervision of normal first pregnancy, second trimester: Secondary | ICD-10-CM | POA: Insufficient documentation

## 2020-11-28 DIAGNOSIS — O26893 Other specified pregnancy related conditions, third trimester: Secondary | ICD-10-CM | POA: Diagnosis present

## 2020-11-28 DIAGNOSIS — D62 Acute posthemorrhagic anemia: Secondary | ICD-10-CM | POA: Diagnosis not present

## 2020-11-28 LAB — CBC
HCT: 39.4 % (ref 36.0–46.0)
Hemoglobin: 13.5 g/dL (ref 12.0–15.0)
MCH: 29.9 pg (ref 26.0–34.0)
MCHC: 34.3 g/dL (ref 30.0–36.0)
MCV: 87.2 fL (ref 80.0–100.0)
Platelets: 323 10*3/uL (ref 150–400)
RBC: 4.52 MIL/uL (ref 3.87–5.11)
RDW: 16.2 % — ABNORMAL HIGH (ref 11.5–15.5)
WBC: 10 10*3/uL (ref 4.0–10.5)
nRBC: 0 % (ref 0.0–0.2)

## 2020-11-28 LAB — COMPREHENSIVE METABOLIC PANEL
ALT: 11 U/L (ref 0–44)
AST: 22 U/L (ref 15–41)
Albumin: 3.5 g/dL (ref 3.5–5.0)
Alkaline Phosphatase: 173 U/L — ABNORMAL HIGH (ref 38–126)
Anion gap: 12 (ref 5–15)
BUN: 7 mg/dL (ref 6–20)
CO2: 20 mmol/L — ABNORMAL LOW (ref 22–32)
Calcium: 9.7 mg/dL (ref 8.9–10.3)
Chloride: 104 mmol/L (ref 98–111)
Creatinine, Ser: 0.52 mg/dL (ref 0.44–1.00)
GFR, Estimated: >60 mL/min (ref >60)
Glucose, Bld: 83 mg/dL (ref 70–99)
Potassium: 3.7 mmol/L (ref 3.5–5.1)
Sodium: 136 mmol/L (ref 135–145)
Total Bilirubin: 0.6 mg/dL (ref 0.3–1.2)
Total Protein: 7.9 g/dL (ref 6.5–8.1)

## 2020-11-28 LAB — TYPE AND SCREEN
ABO/RH(D): O POS
Antibody Screen: NEGATIVE

## 2020-11-28 LAB — RESP PANEL BY RT-PCR (FLU A&B, COVID) ARPGX2
Influenza A by PCR: NEGATIVE
Influenza B by PCR: NEGATIVE
SARS Coronavirus 2 by RT PCR: NEGATIVE

## 2020-11-28 LAB — PROTEIN / CREATININE RATIO, URINE
Creatinine, Urine: 13 mg/dL
Total Protein, Urine: <6 mg/dL

## 2020-11-28 SURGERY — Surgical Case
Anesthesia: Epidural

## 2020-11-28 MED ORDER — OXYCODONE-ACETAMINOPHEN 5-325 MG PO TABS: 1.0000 | ORAL_TABLET | ORAL | Status: DC | PRN

## 2020-11-28 MED ORDER — BUPIVACAINE HCL (PF) 0.25 % IJ SOLN
INTRAMUSCULAR | Status: AC
  Filled 2020-11-28: qty 30

## 2020-11-28 MED ORDER — SIMETHICONE 80 MG PO CHEW
80.0000 mg | CHEWABLE_TABLET | ORAL | Status: DC | PRN
  Filled 2020-11-28: qty 1

## 2020-11-28 MED ORDER — HYDRALAZINE HCL 20 MG/ML IJ SOLN: 10.0000 mg | INTRAMUSCULAR | Status: DC | PRN

## 2020-11-28 MED ORDER — ACETAMINOPHEN 325 MG PO TABS: 650.0000 mg | ORAL_TABLET | ORAL | Status: DC | PRN

## 2020-11-28 MED ORDER — LIDOCAINE HCL (PF) 1 % IJ SOLN: 30.0000 mL | INTRAMUSCULAR | Status: DC | PRN

## 2020-11-28 MED ORDER — NALBUPHINE HCL 10 MG/ML IJ SOLN: 5.0000 mg | INTRAMUSCULAR | Status: DC | PRN

## 2020-11-28 MED ORDER — AMMONIA AROMATIC IN INHA
RESPIRATORY_TRACT | Status: AC
  Filled 2020-11-28: qty 10

## 2020-11-28 MED ORDER — EPHEDRINE 5 MG/ML INJ: 10.0000 mg | INTRAVENOUS | Status: DC | PRN

## 2020-11-28 MED ORDER — NALOXONE HCL 4 MG/10ML IJ SOLN
1.0000 ug/kg/h | INTRAVENOUS | Status: DC | PRN
  Filled 2020-11-28: qty 5

## 2020-11-28 MED ORDER — PHENYLEPHRINE HCL (PRESSORS) 10 MG/ML IV SOLN
INTRAVENOUS | Status: DC | PRN
  Administered 2020-11-28 (×11): 200 ug via INTRAVENOUS

## 2020-11-28 MED ORDER — IBUPROFEN 600 MG PO TABS
600.0000 mg | ORAL_TABLET | Freq: Four times a day (QID) | ORAL | Status: DC
  Filled 2020-11-28: qty 1

## 2020-11-28 MED ORDER — FENTANYL-BUPIVACAINE-NACL 0.5-0.125-0.9 MG/250ML-% EP SOLN
12.0000 mL/h | EPIDURAL | Status: DC | PRN
Start: 2020-11-28 — End: 2020-11-28
  Administered 2020-11-28: 12 mL/h via EPIDURAL

## 2020-11-28 MED ORDER — OXYTOCIN-SODIUM CHLORIDE 30-0.9 UT/500ML-% IV SOLN
INTRAVENOUS | Status: AC
  Filled 2020-11-28: qty 500

## 2020-11-28 MED ORDER — SODIUM CHLORIDE 0.9 % IV SOLN
INTRAVENOUS | Status: DC | PRN
  Administered 2020-11-28: 10 mL via EPIDURAL

## 2020-11-28 MED ORDER — BUTORPHANOL TARTRATE 1 MG/ML IJ SOLN
INTRAMUSCULAR | Status: AC
  Administered 2020-11-28: 2 mg via INTRAVENOUS
  Filled 2020-11-28: qty 2

## 2020-11-28 MED ORDER — ACETAMINOPHEN 500 MG PO TABS: 1000.0000 mg | ORAL_TABLET | Freq: Four times a day (QID) | ORAL | Status: DC

## 2020-11-28 MED ORDER — NALOXONE HCL 0.4 MG/ML IJ SOLN: 0.4000 mg | INTRAMUSCULAR | Status: DC | PRN

## 2020-11-28 MED ORDER — SODIUM CHLORIDE 0.9 % IV SOLN
INTRAVENOUS | Status: DC | PRN
  Administered 2020-11-28: 50 ug/min via INTRAVENOUS

## 2020-11-28 MED ORDER — BUPIVACAINE HCL (PF) 0.25 % IJ SOLN
INTRAMUSCULAR | Status: DC | PRN
  Administered 2020-11-28: 30 mL

## 2020-11-28 MED ORDER — OXYTOCIN 10 UNIT/ML IJ SOLN
INTRAMUSCULAR | Status: AC
  Filled 2020-11-28: qty 2

## 2020-11-28 MED ORDER — PHENYLEPHRINE 40 MCG/ML (10ML) SYRINGE FOR IV PUSH (FOR BLOOD PRESSURE SUPPORT)
PREFILLED_SYRINGE | INTRAVENOUS | Status: AC
  Administered 2020-11-28: 100 ug via INTRAVENOUS
  Filled 2020-11-28: qty 10

## 2020-11-28 MED ORDER — LABETALOL HCL 5 MG/ML IV SOLN
40.0000 mg | INTRAVENOUS | Status: DC | PRN
Start: 2020-11-28 — End: 2020-11-30

## 2020-11-28 MED ORDER — SODIUM CHLORIDE (PF) 0.9 % IJ SOLN
INTRAMUSCULAR | Status: AC
  Filled 2020-11-28: qty 50

## 2020-11-28 MED ORDER — LACTATED RINGERS IV SOLN: INTRAVENOUS | Status: DC

## 2020-11-28 MED ORDER — OXYTOCIN-SODIUM CHLORIDE 30-0.9 UT/500ML-% IV SOLN
2.5000 [IU]/h | INTRAVENOUS | Status: DC
  Administered 2020-11-28: 30 [IU] via INTRAVENOUS

## 2020-11-28 MED ORDER — DIPHENHYDRAMINE HCL 50 MG/ML IJ SOLN: 12.5000 mg | INTRAMUSCULAR | Status: DC | PRN

## 2020-11-28 MED ORDER — NALBUPHINE HCL 10 MG/ML IJ SOLN
5.0000 mg | INTRAMUSCULAR | Status: DC | PRN
  Administered 2020-11-29: 5 mg via INTRAVENOUS
  Filled 2020-11-28: qty 1

## 2020-11-28 MED ORDER — SODIUM CHLORIDE 0.9 % IV SOLN
500.0000 mg | INTRAVENOUS | Status: DC
  Administered 2020-11-28 – 2020-11-29 (×2): 500 mg via INTRAVENOUS
  Filled 2020-11-28 (×2): qty 500

## 2020-11-28 MED ORDER — FENTANYL-BUPIVACAINE-NACL 0.5-0.125-0.9 MG/250ML-% EP SOLN
EPIDURAL | Status: AC
  Filled 2020-11-28: qty 250

## 2020-11-28 MED ORDER — ACETAMINOPHEN 500 MG PO TABS
1000.0000 mg | ORAL_TABLET | Freq: Four times a day (QID) | ORAL | Status: DC
  Administered 2020-11-29 – 2020-11-30 (×6): 1000 mg via ORAL
  Filled 2020-11-28 (×6): qty 2

## 2020-11-28 MED ORDER — BUPIVACAINE HCL (PF) 0.5 % IJ SOLN
INTRAMUSCULAR | Status: AC
  Filled 2020-11-28: qty 30

## 2020-11-28 MED ORDER — KETOROLAC TROMETHAMINE 30 MG/ML IJ SOLN: 30.0000 mg | Freq: Four times a day (QID) | INTRAMUSCULAR | Status: DC

## 2020-11-28 MED ORDER — LABETALOL HCL 5 MG/ML IV SOLN: 80.0000 mg | INTRAVENOUS | Status: DC | PRN

## 2020-11-28 MED ORDER — GABAPENTIN 300 MG PO CAPS
300.0000 mg | ORAL_CAPSULE | Freq: Every day | ORAL | Status: DC
  Administered 2020-11-29 (×2): 300 mg via ORAL
  Filled 2020-11-28 (×2): qty 1

## 2020-11-28 MED ORDER — OXYCODONE-ACETAMINOPHEN 5-325 MG PO TABS: 2.0000 | ORAL_TABLET | ORAL | Status: DC | PRN

## 2020-11-28 MED ORDER — LABETALOL HCL 5 MG/ML IV SOLN: 20.0000 mg | INTRAVENOUS | Status: DC | PRN

## 2020-11-28 MED ORDER — SENNOSIDES-DOCUSATE SODIUM 8.6-50 MG PO TABS
2.0000 | ORAL_TABLET | Freq: Every day | ORAL | Status: DC
  Administered 2020-11-29 – 2020-11-30 (×2): 2 via ORAL
  Filled 2020-11-28 (×2): qty 2

## 2020-11-28 MED ORDER — PHENYLEPHRINE 40 MCG/ML (10ML) SYRINGE FOR IV PUSH (FOR BLOOD PRESSURE SUPPORT): 80.0000 ug | PREFILLED_SYRINGE | INTRAVENOUS | Status: DC | PRN

## 2020-11-28 MED ORDER — SOD CITRATE-CITRIC ACID 500-334 MG/5ML PO SOLN
30.0000 mL | ORAL | Status: DC | PRN
  Filled 2020-11-28: qty 30

## 2020-11-28 MED ORDER — LIDOCAINE HCL (PF) 2 % IJ SOLN
INTRAMUSCULAR | Status: DC | PRN
  Administered 2020-11-28: 60 mg via EPIDURAL
  Administered 2020-11-28: 100 mg via EPIDURAL
  Administered 2020-11-28: 80 mg via EPIDURAL
  Administered 2020-11-28: 60 mg via EPIDURAL
  Administered 2020-11-28: 100 mg via EPIDURAL

## 2020-11-28 MED ORDER — OXYCODONE HCL 5 MG PO TABS
5.0000 mg | ORAL_TABLET | ORAL | Status: DC | PRN
  Administered 2020-11-29 – 2020-11-30 (×5): 5 mg via ORAL
  Filled 2020-11-28 (×5): qty 1

## 2020-11-28 MED ORDER — CEFAZOLIN SODIUM-DEXTROSE 2-4 GM/100ML-% IV SOLN
INTRAVENOUS | Status: AC
  Administered 2020-11-28: 2000 mg
  Filled 2020-11-28: qty 100

## 2020-11-28 MED ORDER — MORPHINE SULFATE (PF) 0.5 MG/ML IJ SOLN
INTRAMUSCULAR | Status: DC | PRN
  Administered 2020-11-28: 4 mg via EPIDURAL

## 2020-11-28 MED ORDER — WITCH HAZEL-GLYCERIN EX PADS: 1.0000 "application " | MEDICATED_PAD | CUTANEOUS | Status: DC | PRN

## 2020-11-28 MED ORDER — LIDOCAINE HCL (PF) 1 % IJ SOLN
INTRAMUSCULAR | Status: DC | PRN
  Administered 2020-11-28: 3 mL via SUBCUTANEOUS
  Administered 2020-11-28: 2 mL via SUBCUTANEOUS

## 2020-11-28 MED ORDER — LACTATED RINGERS IV SOLN: 500.0000 mL | INTRAVENOUS | Status: DC | PRN

## 2020-11-28 MED ORDER — LIDOCAINE HCL (PF) 1 % IJ SOLN
INTRAMUSCULAR | Status: AC
  Filled 2020-11-28: qty 30

## 2020-11-28 MED ORDER — SCOPOLAMINE 1 MG/3DAYS TD PT72
1.0000 | MEDICATED_PATCH | Freq: Once | TRANSDERMAL | Status: DC
  Filled 2020-11-28: qty 1

## 2020-11-28 MED ORDER — NALBUPHINE HCL 10 MG/ML IJ SOLN: 5.0000 mg | Freq: Once | INTRAMUSCULAR | Status: DC | PRN

## 2020-11-28 MED ORDER — OXYTOCIN BOLUS FROM INFUSION: 333.0000 mL | Freq: Once | INTRAVENOUS | Status: DC

## 2020-11-28 MED ORDER — DEXAMETHASONE SODIUM PHOSPHATE 10 MG/ML IJ SOLN
INTRAMUSCULAR | Status: DC | PRN
  Administered 2020-11-28: 10 mg via INTRAVENOUS

## 2020-11-28 MED ORDER — COCONUT OIL OIL: 1.0000 "application " | TOPICAL_OIL | Status: DC | PRN

## 2020-11-28 MED ORDER — BUTORPHANOL TARTRATE 1 MG/ML IJ SOLN
2.0000 mg | Freq: Once | INTRAMUSCULAR | Status: AC
Start: 2020-11-28 — End: 2020-11-28

## 2020-11-28 MED ORDER — ONDANSETRON HCL 4 MG/2ML IJ SOLN: 4.0000 mg | Freq: Four times a day (QID) | INTRAMUSCULAR | Status: DC | PRN

## 2020-11-28 MED ORDER — NIFEDIPINE ER OSMOTIC RELEASE 30 MG PO TB24
30.0000 mg | ORAL_TABLET | Freq: Every day | ORAL | Status: DC
  Administered 2020-11-29 (×2): 30 mg via ORAL
  Filled 2020-11-28 (×2): qty 1

## 2020-11-28 MED ORDER — OXYTOCIN-SODIUM CHLORIDE 30-0.9 UT/500ML-% IV SOLN
2.5000 [IU]/h | INTRAVENOUS | Status: AC
  Administered 2020-11-29: 2.5 [IU]/h via INTRAVENOUS
  Filled 2020-11-28: qty 500

## 2020-11-28 MED ORDER — MORPHINE SULFATE (PF) 0.5 MG/ML IJ SOLN
INTRAMUSCULAR | Status: AC
  Filled 2020-11-28: qty 10

## 2020-11-28 MED ORDER — ENOXAPARIN SODIUM 40 MG/0.4ML IJ SOSY
40.0000 mg | PREFILLED_SYRINGE | INTRAMUSCULAR | Status: DC
  Administered 2020-11-29: 40 mg via SUBCUTANEOUS
  Filled 2020-11-28: qty 0.4

## 2020-11-28 MED ORDER — ZOLPIDEM TARTRATE 5 MG PO TABS: 5.0000 mg | ORAL_TABLET | Freq: Every evening | ORAL | Status: DC | PRN

## 2020-11-28 MED ORDER — ONDANSETRON HCL 4 MG/2ML IJ SOLN
INTRAMUSCULAR | Status: DC | PRN
  Administered 2020-11-28: 4 mg via INTRAVENOUS

## 2020-11-28 MED ORDER — TETANUS-DIPHTH-ACELL PERTUSSIS 5-2.5-18.5 LF-MCG/0.5 IM SUSY: 0.5000 mL | PREFILLED_SYRINGE | Freq: Once | INTRAMUSCULAR | Status: DC

## 2020-11-28 MED ORDER — PRENATAL MULTIVITAMIN CH
1.0000 | ORAL_TABLET | Freq: Every day | ORAL | Status: DC
  Administered 2020-11-29 – 2020-11-30 (×2): 1 via ORAL
  Filled 2020-11-28 (×2): qty 1

## 2020-11-28 MED ORDER — MENTHOL 3 MG MT LOZG
1.0000 | LOZENGE | OROMUCOSAL | Status: DC | PRN
  Filled 2020-11-28: qty 9

## 2020-11-28 MED ORDER — MISOPROSTOL 200 MCG PO TABS
ORAL_TABLET | ORAL | Status: AC
  Filled 2020-11-28: qty 4

## 2020-11-28 MED ORDER — SODIUM CHLORIDE 0.9 % IV SOLN
INTRAVENOUS | Status: AC
  Filled 2020-11-28: qty 500

## 2020-11-28 MED ORDER — ONDANSETRON HCL 4 MG/2ML IJ SOLN: 4.0000 mg | Freq: Three times a day (TID) | INTRAMUSCULAR | Status: DC | PRN

## 2020-11-28 MED ORDER — LIDOCAINE-EPINEPHRINE (PF) 1.5 %-1:200000 IJ SOLN
INTRAMUSCULAR | Status: DC | PRN
  Administered 2020-11-28: 3 mL via EPIDURAL

## 2020-11-28 MED ORDER — SOD CITRATE-CITRIC ACID 500-334 MG/5ML PO SOLN
ORAL | Status: AC
  Filled 2020-11-28: qty 15

## 2020-11-28 MED ORDER — SIMETHICONE 80 MG PO CHEW
80.0000 mg | CHEWABLE_TABLET | Freq: Three times a day (TID) | ORAL | Status: DC
  Administered 2020-11-29 – 2020-11-30 (×5): 80 mg via ORAL
  Filled 2020-11-28 (×5): qty 1

## 2020-11-28 MED ORDER — DIPHENHYDRAMINE HCL 25 MG PO CAPS
25.0000 mg | ORAL_CAPSULE | Freq: Four times a day (QID) | ORAL | Status: DC | PRN
  Administered 2020-11-29: 25 mg via ORAL
  Filled 2020-11-28: qty 1

## 2020-11-28 MED ORDER — SODIUM CHLORIDE 0.9% FLUSH: 3.0000 mL | INTRAVENOUS | Status: DC | PRN

## 2020-11-28 MED ORDER — KETOROLAC TROMETHAMINE 30 MG/ML IJ SOLN
30.0000 mg | Freq: Four times a day (QID) | INTRAMUSCULAR | Status: DC
  Administered 2020-11-28: 30 mg via INTRAVENOUS
  Filled 2020-11-28: qty 1

## 2020-11-28 MED ORDER — MORPHINE SULFATE (PF) 2 MG/ML IV SOLN: 1.0000 mg | INTRAVENOUS | Status: DC | PRN

## 2020-11-28 MED ORDER — DIBUCAINE (PERIANAL) 1 % EX OINT: 1.0000 "application " | TOPICAL_OINTMENT | CUTANEOUS | Status: DC | PRN

## 2020-11-28 MED ORDER — LACTATED RINGERS IV SOLN
500.0000 mL | Freq: Once | INTRAVENOUS | Status: AC
  Administered 2020-11-28: 500 mL via INTRAVENOUS

## 2020-11-28 SURGICAL SUPPLY — 29 items
BARRIER ADHS 3X4 INTERCEED (GAUZE/BANDAGES/DRESSINGS) ×2 IMPLANT
CHLORAPREP W/TINT 26 (MISCELLANEOUS) ×2 IMPLANT
DRESSING TELFA 8X10 (GAUZE/BANDAGES/DRESSINGS) ×2 IMPLANT
DRSG TELFA 3X8 NADH (GAUZE/BANDAGES/DRESSINGS) ×2 IMPLANT
ELECT CAUTERY BLADE 6.4 (BLADE) ×2 IMPLANT
ELECT REM PT RETURN 9FT ADLT (ELECTROSURGICAL) ×2
ELECTRODE REM PT RTRN 9FT ADLT (ELECTROSURGICAL) ×1 IMPLANT
GAUZE SPONGE 4X4 12PLY STRL (GAUZE/BANDAGES/DRESSINGS) ×2 IMPLANT
GAUZE SPONGE 4X4 3PLY NS LF (GAUZE/BANDAGES/DRESSINGS) ×2 IMPLANT
GLOVE SURG SYN 8.0 (GLOVE) ×2 IMPLANT
GOWN STRL REUS W/ TWL LRG LVL3 (GOWN DISPOSABLE) ×2 IMPLANT
GOWN STRL REUS W/ TWL XL LVL3 (GOWN DISPOSABLE) ×1 IMPLANT
GOWN STRL REUS W/TWL LRG LVL3 (GOWN DISPOSABLE) ×2
GOWN STRL REUS W/TWL XL LVL3 (GOWN DISPOSABLE) ×1
MANIFOLD NEPTUNE II (INSTRUMENTS) ×2 IMPLANT
MAT PREVALON FULL STRYKER (MISCELLANEOUS) ×2 IMPLANT
NEEDLE HYPO 22GX1.5 SAFETY (NEEDLE) ×2 IMPLANT
NS IRRIG 1000ML POUR BTL (IV SOLUTION) ×2 IMPLANT
PACK C SECTION AR (MISCELLANEOUS) ×2 IMPLANT
PAD OB MATERNITY 4.3X12.25 (PERSONAL CARE ITEMS) ×2 IMPLANT
PAD PREP 24X41 OB/GYN DISP (PERSONAL CARE ITEMS) ×2 IMPLANT
SCRUB EXIDINE 4% CHG 4OZ (MISCELLANEOUS) ×2 IMPLANT
STRAP SAFETY 5IN WIDE (MISCELLANEOUS) ×2 IMPLANT
SUT CHROMIC 1 CTX 36 (SUTURE) ×6 IMPLANT
SUT PLAIN GUT 0 (SUTURE) ×4 IMPLANT
SUT VIC AB 0 CT1 36 (SUTURE) ×6 IMPLANT
SYR 30ML LL (SYRINGE) ×4 IMPLANT
TAPE MEDIFIX FOAM 3 (GAUZE/BANDAGES/DRESSINGS) ×2 IMPLANT
WATER STERILE IRR 500ML POUR (IV SOLUTION) ×2 IMPLANT

## 2020-11-28 NOTE — Discharge Summary (Signed)
Obstetrical Discharge Summary  Patient Name: Yvette Oliver DOB: 1985-09-17 MRN: [id number]  Date of Admission: 11/28/2020 Date of Delivery: 11/28/20 Delivered by: Huel Cote MD Date of Discharge: 11/30/20  Primary OB: [place]   LMP:No LMP recorded. EDC Estimated Date of Delivery: 12/03/20 Gestational Age at Delivery: [redacted]w[redacted]d   Antepartum complications: incompetent cx , cerclage removed  Admitting Diagnosis: labor  Secondary Diagnosis: Patient Active Problem List   Diagnosis Date Noted   Cesarean delivery delivered 11/30/2020   Pre-eclampsia in postpartum period 11/30/2020   Encounter for supervision of normal first pregnancy in second trimester 11/28/2020   High-risk pregnancy 04/27/2020    Augmentation: AROM Complications: none  reassuring fetal monitoring , CPD Intrapartum complications/course: cx to 7 cm , several prolonged decels.  Date of Delivery: 11/30/2020  Delivered By: Huel Cote MD Delivery Type: primary cesarean section, low transverse incision, excision of pedunculated uterine fibroid  Anesthesia: epidural Placenta: spontaneous Laceration:  Episiotomy: none Newborn Data: Female , Apgars 8/9 Birth Weight:  3290g APGAR: ,   Newborn Delivery   Birth date/time:  Delivery type:         Postpartum Procedures: Started on Procardia, d/c'd with 60mg   Edinburgh:  Edinburgh Postnatal Depression Scale Screening Tool 11/29/2020 11/28/2020  I have been able to laugh and see the funny side of things. (No Data) (No Data)      Patient had an uncomplicated postpartum course.  By time of discharge on POD#2, her pain was controlled on oral pain medications; she had appropriate lochia and was ambulating, voiding without difficulty, tolerating regular diet and passing flatus.   She was deemed stable for discharge to home.    Discharge Physical Exam:  BP 122/89 (BP Location: Left Arm)   Pulse 97   Temp 97.6 F (36.4 C) (Oral)   Resp 18   Ht 5'  3" (1.6 m)   Wt 88.9 kg   SpO2 97%   Breastfeeding Unknown   BMI 34.72 kg/m   General: NAD CV: RRR Pulm: nl effort ABD: s/nd/nt, fundus firm and below the umbilicus Lochia: moderate Incision: c/d/i DVT Evaluation: LE non-ttp, no evidence of DVT on exam.  Hemoglobin  Date Value Ref Range Status  11/29/2020 10.6 (L) 12.0 - 15.0 g/dL Final   HCT  Date Value Ref Range Status  11/29/2020 31.5 (L) 36.0 - 46.0 % Final     Disposition: stable, discharge to home. Baby Feeding: breastmilk and formula Baby Disposition: home with mom  Rh Immune globulin given: n/a Rubella vaccine given: Immune Tdap vaccine given in AP or PP setting: 09/10/20 Flu vaccine given in AP or PP setting: declined wants to wait until Oct  Contraception: Nexplanon  Prenatal Labs:  Blood type/Rh O Pos  Antibody screen neg  Rubella Immune  Varicella Immune  RPR NR  HBsAg Neg  HIV NR  GC neg  Chlamydia neg  Genetic screening cfDNA negative   1 hour GTT    3 hour GTT    GBS neg    Complications: None   Plan:  Yvette Oliver was discharged to home in good condition.    Discharge Medications: Allergies as of 11/30/2020   No Known Allergies      Medication List     STOP taking these medications    doxylamine (Sleep) 25 MG tablet Commonly known as: UNISOM   ondansetron 4 MG tablet Commonly known as: ZOFRAN       TAKE these medications    acetaminophen 500 MG tablet Commonly  known as: TYLENOL Take 1 tablet (500 mg total) by mouth every 6 (six) hours as needed.   cholecalciferol 25 MCG (1000 UNIT) tablet Commonly known as: VITAMIN D3 Take 1,000 Units by mouth daily.   ferrous sulfate 325 (65 FE) MG tablet Take 325 mg by mouth daily with breakfast.   ibuprofen 600 MG tablet Commonly known as: ADVIL Take 1 tablet (600 mg total) by mouth every 6 (six) hours as needed.   NIFEdipine 60 MG 24 hr tablet Commonly known as: ADALAT CC Take 1 tablet (60 mg total) by mouth daily.    omeprazole 20 MG capsule Commonly known as: PRILOSEC Take 20 mg by mouth daily as needed.   oxyCODONE 5 MG immediate release tablet Commonly known as: Oxy IR/ROXICODONE Take 1-2 tablets (5-10 mg total) by mouth every 4 (four) hours as needed for moderate pain.   prenatal multivitamin Tabs tablet Take 1 tablet by mouth daily at 12 noon.   senna-docusate 8.6-50 MG tablet Commonly known as: Senokot-S Take 2 tablets by mouth daily as needed for mild constipation.   simethicone 80 MG chewable tablet Commonly known as: MYLICON Chew 1 tablet (80 mg total) by mouth as needed for flatulence.         Follow-up Information     Schermerhorn, Gwen Her, MD Follow up in 1 week(s).   Specialty: Obstetrics and Gynecology Why: blood pressure check Contact information: [address] [place] [address] [phone]         Schermerhorn, Gwen Her, MD Follow up in 2 week(s).   Specialty: Obstetrics and Gynecology Why: Post op visit Contact information: [address] [place] [address] [phone]                 Signed: Clydene Laming  11/30/2020 8:23 AM

## 2020-11-28 NOTE — Progress Notes (Signed)
Pt patting baby during BP.

## 2020-11-28 NOTE — Transfer of Care (Signed)
Immediate Anesthesia Transfer of Care Note  Patient: Yvette Oliver  Procedure(s) Performed: CESAREAN SECTION  Patient Location: Mother/Baby  Anesthesia Type:Epidural  Level of Consciousness: awake, alert  and oriented  Airway & Oxygen Therapy: Patient Spontanous Breathing  Post-op Assessment: Report given to RN  Post vital signs: Reviewed and stable  Last Vitals:  Vitals Value Taken Time  BP    Temp    Pulse    Resp    SpO2      Last Pain:  Vitals:   11/28/20 1900  TempSrc:   PainSc: 0-No pain         Complications: No notable events documented.

## 2020-11-28 NOTE — Anesthesia Procedure Notes (Signed)
Epidural Patient location during procedure: OB Start time: 11/28/2020 5:00 PM End time: 11/28/2020 5:25 PM  Staffing Anesthesiologist: Arita Miss, MD Performed: anesthesiologist   Preanesthetic Checklist Completed: patient identified, IV checked, site marked, risks and benefits discussed, surgical consent, monitors and equipment checked, pre-op evaluation and timeout performed  Epidural Patient position: sitting Prep: DuraPrep Patient monitoring: heart rate, continuous pulse ox and blood pressure Approach: midline Location: L2-L3 Injection technique: LOR saline  Needle:  Needle type: Tuohy  Needle gauge: 18 G Needle length: 9 cm and 9 Needle insertion depth: 7 cm Catheter type: closed end Catheter size: 19 Gauge Catheter at skin depth: 12 cm Test dose: negative and 1.5% lidocaine with Epi 1:200 K  Assessment Sensory level: T10 Events: blood not aspirated, injection not painful, no injection resistance, no paresthesia and negative IV test  Additional Notes First epidural catheter at L3-4 noted to have +heme aspiration; immediately removed. Second epidural catheter placed at L2-3 with -aspiration, - test dose. Pt. Evaluated and documentation done after procedure finished. Patient identified. Risks/Benefits/Options discussed with patient including but not limited to bleeding, infection, nerve damage, paralysis, failed block, incomplete pain control, headache, blood pressure changes, nausea, vomiting, reactions to medication both or allergic, itching and postpartum back pain. Confirmed with bedside nurse the patient's most recent platelet count. Confirmed with patient that they are not currently taking any anticoagulation, have any bleeding history or any family history of bleeding disorders. Patient expressed understanding and wished to proceed. All questions were answered. Sterile technique was used throughout the entire procedure. Please see nursing notes for vital signs. Test dose  was given through epidural catheter and negative prior to continuing to dose epidural or start infusion. Warning signs of high block given to the patient including shortness of breath, tingling/numbness in hands, complete motor block, or any concerning symptoms with instructions to call for help. Patient was given instructions on fall risk and not to get out of bed. All questions and concerns addressed with instructions to call with any issues or inadequate analgesia.     Patient tolerated the insertion well without immediate complications.  Reason for block: procedure for painReason for block:procedure for pain

## 2020-11-28 NOTE — Anesthesia Preprocedure Evaluation (Signed)
Anesthesia Evaluation  Patient identified by MRN, date of birth, ID band Patient awake    Reviewed: Allergy & Precautions, NPO status , Patient's Chart, lab work & pertinent test results  History of Anesthesia Complications Negative for: history of anesthetic complications  Airway Mallampati: III  TM Distance: >3 FB Neck ROM: Full    Dental no notable dental hx. (+) Teeth Intact   Pulmonary neg pulmonary ROS, neg sleep apnea, neg COPD, Patient abstained from smoking.Not current smoker,    Pulmonary exam normal breath sounds clear to auscultation       Cardiovascular Exercise Tolerance: Good METShypertension, (-) CAD and (-) Past MI (-) dysrhythmias  Rhythm:Regular Rate:Normal - Systolic murmurs Gestational HTN vs preeclampsia   Neuro/Psych  Headaches, negative neurological ROS  negative psych ROS   GI/Hepatic GERD  ,(+)     (-) substance abuse  ,   Endo/Other  neg diabetes  Renal/GU negative Renal ROS     Musculoskeletal   Abdominal   Peds  Hematology   Anesthesia Other Findings Past Medical History: No date: GERD (gastroesophageal reflux disease) No date: Migraines No date: Right ovarian cyst  Reproductive/Obstetrics (+) Pregnancy                             Anesthesia Physical Anesthesia Plan  ASA: 2  Anesthesia Plan: Epidural   Post-op Pain Management:    Induction:   PONV Risk Score and Plan: 2 and Treatment may vary due to age or medical condition and Ondansetron  Airway Management Planned: Natural Airway  Additional Equipment:   Intra-op Plan:   Post-operative Plan:   Informed Consent: I have reviewed the patients History and Physical, chart, labs and discussed the procedure including the risks, benefits and alternatives for the proposed anesthesia with the patient or authorized representative who has indicated his/her understanding and acceptance.       Plan  Discussed with: Surgeon  Anesthesia Plan Comments: (Discussed R/B/A of neuraxial anesthesia technique with patient: - rare risks of spinal/epidural hematoma, nerve damage, infection - Risk of PDPH - Risk of itching - Risk of nausea and vomiting - Risk of poor block necessitating replacement of epidural. - Risk of allergic reactions. Patient voiced understanding.)        Anesthesia Quick Evaluation

## 2020-11-28 NOTE — Op Note (Signed)
[name], Yvette Oliver MEDICAL RECORD NO: [id number] ACCOUNT NO: [id number] DATE OF BIRTH: 1985-08-04 FACILITY: [facility] LOCATION: [facility] PHYSICIAN: Boykin Nearing, MD  Operative Report   DATE OF PROCEDURE: 11/28/2020  PREOPERATIVE DIAGNOSES:  1.  39+2 weeks estimated gestational age. 2.  Nonreassuring fetal monitoring.  POSTOPERATIVE DIAGNOSES: 1.  39+2 weeks estimated gestational age. 2.  Nonreassuring fetal monitoring. 3.  Cephalopelvic disproportion. 4.  Pedunculated uterine fibroid.  PROCEDURE:   1.  Primary low transverse cesarean section. 2.  Uterine fibroid myomectomy.  SURGEON:  Boykin Nearing, MD  FIRST ASSISTANT:  Avelino Leeds, certified nurse midwife.  INDICATIONS:  A 35 year old gravida 1, para 0, patient with history of incompetent cervix, status post cervical cerclage placement and removal that presented to labor and delivery in active labor.  The patient made it to 7 cm with several prolonged  decelerations and overall a nonreassuring fetal monitoring.  The patient elected for primary cesarean section.  DESCRIPTION OF PROCEDURE:  After adequate surgical dosing of a continuous lumbar epidural, the patient was placed in dorsal supine position, hip roll onto the right side.  The patient's abdomen was prepped and draped in normal sterile fashion.  Timeout  was performed.  The patient did receive 2 grams of IV Ancef and 500 mg azithromycin for surgical prophylaxis.  A Pfannenstiel incision was made 2 fingerbreadths above the symphysis pubis.  Sharp dissection was used to identify the fascia.  Fascia was  opened in midline, opened in a transverse fashion.  Superior aspect of the fascia was grasped with Kocher clamps and the recti muscles were dissected free.  Inferior aspect of the fascia was grasped with Kocher clamps.  Pyramidalis muscle was dissected  free.  Entry into the peritoneal cavity was accomplished sharply.  The vesicouterine peritoneal fold  was opened and the bladder was reflected inferiorly.  A low transverse uterine incision was made.  Upon entry into the endometrial cavity, clear fluid  resulted.  Extremely wedged fetal head was elevated.  A fetal arm extruded out from the uterine incision and with re-manipulation of the arm and the head, ultimately, the head was delivered and the infant was delivered through the incision.  Vigorous  female was dried on mother's abdomen for 60 seconds and cord was doubly clamped and vigorous female was passed to nursery staff who assigned Apgar scores of 8 and 9.  The placenta was manually delivered and the uterus was exteriorized.  Of note, there  was a 4 x 3 cm pedunculated uterine fundal fibroid.  The endometrial cavity was wiped clean with laparotomy tape.  There was an extension on the right cervix extending down to the bladder.  The uterus was repaired with 1 chromic suture in a running  locking fashion.  Several figure-of-eight sutures were required to repair the extension.  The bladder muscularis was oversewn with 2-0 Vicryl suture given a small amount of bleeding.  The uterine fibroid was removed after clamping with a Kelly clamp  excising the fibroid and then oversewing the pedicle with 0 Vicryl suture.  Good hemostasis was noted.  Fallopian tubes and ovaries appeared normal.  Posterior cul-de-sac was irrigated and suctioned.  The uterus was placed back into the abdominal cavity.   Uterine incision appeared hemostatic after 3 additional figure-of-eight sutures were placed at the uterine incision site.  The pericolic gutters were wiped clean with laparotomy tape.  Fascia was then closed with 0 Vicryl suture in a running nonlocking  fashion.  The fascial edges were  injected with a solution of 60 mL of 0.25% Marcaine with 20 mL normal saline.  Approximately 50 mL of the solution was injected.  Subcutaneous tissues were irrigated and bovied and the skin was reapproximated with Insorb  absorbable  staples.  Good cosmetic effect.    QUANTITATIVE BLOOD LOSS: 855.  INTRAOPERATIVE FLUIDS:  1000 mL  The patient tolerated the procedure well and was taken to recovery room in good condition.   PAA D: 11/28/2020 8:57:01 pm T: 11/28/2020 10:30:00 pm  JOB: [id number]/ [id number]

## 2020-11-28 NOTE — Progress Notes (Signed)
Patient ID: Yvette Oliver, female   DOB: 12/01/1985, 35 y.o.   MRN: [id number] Non reassuring fetal monitoring . Pt and husband are interested in Primary LTCS . I did expressed with her cx at 7 cm that we may be able to by alittle more time to see if SVD could be done . They want to move to cesarean section . Risks of the procedure d/w with the pt . All questions answered .

## 2020-11-28 NOTE — H&P (Signed)
OB History & Physical   History of Present Illness:   Chief Complaint: Painful uterine contractions  HPI:  Yvette Oliver is a 35 y.o. G1P0 female at [redacted]w[redacted]d dated by [place].  She presents to L&D for painful uterine contractions  Reports active fetal movement  Contractions: every 1.5 to 3 minutes LOF/SROM: intact Vaginal bleeding: denies  Factors complicating pregnancy:  Anemia Chlamydia at NOB Obesity Migraines without aura Right ovarian cyst Fibroids Vaginismus Incompetent cervix Asymptomatic bacteruria New onset itching  Patient Active Problem List   Diagnosis Date Noted   Encounter for supervision of normal first pregnancy in second trimester 11/28/2020   Indication for care in labor or delivery 11/09/2020   Cervical cerclage suture present 07/07/2020   Cervical funneling 07/06/2020   High-risk pregnancy 04/27/2020     Maternal Medical History:   Past Medical History:  Diagnosis Date   GERD (gastroesophageal reflux disease)    Migraines    Right ovarian cyst     Past Surgical History:  Procedure Laterality Date   CERVICAL CERCLAGE N/A 07/06/2020   Procedure: CERCLAGE CERVICAL;  Surgeon: Boykin Nearing, MD;  Location: [facility] ORS;  Service: Gynecology;  Laterality: N/A;    No Known Allergies  Prior to Admission medications   Medication Sig Start Date End Date Taking? Authorizing Provider  cholecalciferol (VITAMIN D3) 25 MCG (1000 UNIT) tablet Take 1,000 Units by mouth daily.   Yes [provider]  doxylamine, Sleep, (UNISOM) 25 MG tablet Take by mouth at bedtime as needed (for nausea).   Yes [provider]  ferrous sulfate 325 (65 FE) MG tablet Take 325 mg by mouth daily with breakfast.   Yes [provider]  omeprazole (PRILOSEC) 20 MG capsule Take 20 mg by mouth daily as needed.   Yes [provider]  ondansetron (ZOFRAN) 4 MG tablet Take 4 mg by mouth every 8 (eight) hours as needed for nausea or vomiting.   Yes  [provider]  Prenatal Vit-Fe Fumarate-FA (PRENATAL MULTIVITAMIN) TABS tablet Take 1 tablet by mouth daily at 12 noon.   Yes [provider]     Prenatal care site:  [facility]  Social History: She  reports that she has never smoked. She has never used smokeless tobacco. She reports that she does not drink alcohol and does not use drugs.  Family History: family history is not on file.   Review of Systems: A full review of systems was performed and negative except as noted in the HPI.     Physical Exam:  Vital Signs: BP 101/67   Pulse (!) 108   Temp (!) 97.5 F (36.4 C) (Oral)   Resp 16   Ht 5\' 3"  (1.6 m)   Wt 88.9 kg   SpO2 99%   BMI 34.72 kg/m  Physical Exam Constitutional:      Appearance: Normal appearance. She is obese.  HENT:     Head: Normocephalic.     Mouth/Throat:     Mouth: Mucous membranes are moist.  Cardiovascular:     Rate and Rhythm: Normal rate and regular rhythm.     Pulses: Normal pulses.     Heart sounds: Normal heart sounds.  Pulmonary:     Effort: Pulmonary effort is normal.     Breath sounds: Normal breath sounds.  Abdominal:     Palpations: Abdomen is soft.  Genitourinary:    General: Normal vulva.  Musculoskeletal:        General: Normal range of motion.  Cervical back: Normal range of motion and neck supple.  Skin:    General: Skin is warm.  Neurological:     Mental Status: She is alert and oriented to person, place, and time.  Psychiatric:        Mood and Affect: Mood normal.        Behavior: Behavior normal.        Thought Content: Thought content normal.        Judgment: Judgment normal.    General: no acute distress.  HEENT: normocephalic, atraumatic Heart: regular rate & rhythm.  No murmurs/rubs/gallops Lungs: clear to auscultation bilaterally, normal respiratory effort Abdomen: soft, gravid, non-tender;  EFW: 7 lbs Pelvic:   External: Normal external female genitalia  Cervix: Dilation:  7 / Effacement (%): 80 / Station: -1    Extremities: non-tender, symmetric, no edema bilaterally.  DTRs: +2  Neurologic: Alert & oriented x 3.    Results for orders placed or performed during the hospital encounter of 11/28/20 (from the past 24 hour(s))  Protein / creatinine ratio, urine     Status: None   Collection Time: 11/28/20  3:54 PM  Result Value Ref Range   Creatinine, Urine 13 mg/dL   Total Protein, Urine <6 mg/dL   Protein Creatinine Ratio        0.00 - 0.15 mg/mg[Cre]  CBC     Status: Abnormal   Collection Time: 11/28/20  3:55 PM  Result Value Ref Range   WBC 10.0 4.0 - 10.5 K/uL   RBC 4.52 3.87 - 5.11 MIL/uL   Hemoglobin 13.5 12.0 - 15.0 g/dL   HCT 39.4 36.0 - 46.0 %   MCV 87.2 80.0 - 100.0 fL   MCH 29.9 26.0 - 34.0 pg   MCHC 34.3 30.0 - 36.0 g/dL   RDW 16.2 (H) 11.5 - 15.5 %   Platelets 323 150 - 400 K/uL   nRBC 0.0 0.0 - 0.2 %  Type and screen [place]     Status: None   Collection Time: 11/28/20  3:55 PM  Result Value Ref Range   ABO/RH(D) O POS    Antibody Screen NEG    Sample Expiration      12/01/2020,2359 Performed at [facility], [address] [place], [place] [address]   Comprehensive metabolic panel     Status: Abnormal   Collection Time: 11/28/20  3:55 PM  Result Value Ref Range   Sodium 136 135 - 145 mmol/L   Potassium 3.7 3.5 - 5.1 mmol/L   Chloride 104 98 - 111 mmol/L   CO2 20 (L) 22 - 32 mmol/L   Glucose, Bld 83 70 - 99 mg/dL   BUN 7 6 - 20 mg/dL   Creatinine, Ser 0.52 0.44 - 1.00 mg/dL   Calcium 9.7 8.9 - 10.3 mg/dL   Total Protein 7.9 6.5 - 8.1 g/dL   Albumin 3.5 3.5 - 5.0 g/dL   AST 22 15 - 41 U/L   ALT 11 0 - 44 U/L   Alkaline Phosphatase 173 (H) 38 - 126 U/L   Total Bilirubin 0.6 0.3 - 1.2 mg/dL   GFR, Estimated >60 >60 mL/min   Anion gap 12 5 - 15  Resp Panel by RT-PCR (Flu A&B, Covid) Nasopharyngeal Swab     Status: None   Collection Time: 11/28/20  4:01 PM   Specimen: Nasopharyngeal Swab;  Nasopharyngeal(NP) swabs in vial transport medium  Result Value Ref Range   SARS Coronavirus 2 by RT PCR  NEGATIVE NEGATIVE   Influenza A by PCR NEGATIVE NEGATIVE   Influenza B by PCR NEGATIVE NEGATIVE    Pertinent Results:  Prenatal Labs: Blood type/Rh O Pos  Antibody screen neg  Rubella Immune  Varicella Immune  RPR NR  HBsAg Neg  HIV NR  GC neg  Chlamydia neg  Genetic screening cfDNA negative   1 hour GTT   3 hour GTT   GBS neg   FHT:  FHR: 145 bpm, variability: minimal ,  accelerations:  Abscent,  decelerations:  Present recurrent prolonged decels, variables Category/reactivity:  Category II UC:   regular, every 2-3 minutes   Cephalic by [place] and SVE   No results found.  Assessment:  Yvette Oliver is a 35 y.o. G1P0 female at [redacted]w[redacted]d with .   Plan:  1. Admit to Labor & Delivery; consents reviewed and obtained - Covid admission screen   2. Fetal Well being  - Fetal Tracing: category 2 - Group B Streptococcus ppx not indicated: GBS neg - Presentation: cephalic confirmed by SVE   3. Routine OB: - Prenatal labs reviewed, as above - Rh Pos - CBC, T&S, RPR on admit - Clear fluids, IVF  4. Monitoring of labor  - Contractions monitored with external toco - Pelvis proven to be adequate for trial of labor  - Plan for expectant management  - Plan for  continuous fetal monitoring - Maternal pain control as desired; planning regional anesthesia - Anticipate vaginal delivery  5. Post Partum Planning: - Infant feeding: Breast - Contraception: nexplanon - Tdap vaccine: 09/10/20 - Flu vaccine: declined wants to wait until Oct  Shandria Clinch LUCY Lenoard [place], [place] 31/54/00 8:67 PM  Avelino Leeds, [place] Certified Nurse Midwife [place]

## 2020-11-29 ENCOUNTER — Encounter: Payer: Self-pay | Admitting: Obstetrics and Gynecology

## 2020-11-29 LAB — COMPREHENSIVE METABOLIC PANEL
ALT: 10 U/L (ref 0–44)
AST: 25 U/L (ref 15–41)
Albumin: 2.8 g/dL — ABNORMAL LOW (ref 3.5–5.0)
Alkaline Phosphatase: 131 U/L — ABNORMAL HIGH (ref 38–126)
Anion gap: 8 (ref 5–15)
BUN: 10 mg/dL (ref 6–20)
CO2: 20 mmol/L — ABNORMAL LOW (ref 22–32)
Calcium: 9 mg/dL (ref 8.9–10.3)
Chloride: 104 mmol/L (ref 98–111)
Creatinine, Ser: 0.57 mg/dL (ref 0.44–1.00)
GFR, Estimated: >60 mL/min (ref >60)
Glucose, Bld: 109 mg/dL — ABNORMAL HIGH (ref 70–99)
Potassium: 4.8 mmol/L (ref 3.5–5.1)
Sodium: 132 mmol/L — ABNORMAL LOW (ref 135–145)
Total Bilirubin: 0.5 mg/dL (ref 0.3–1.2)
Total Protein: 6.2 g/dL — ABNORMAL LOW (ref 6.5–8.1)

## 2020-11-29 LAB — CBC
HCT: 31.5 % — ABNORMAL LOW (ref 36.0–46.0)
Hemoglobin: 10.6 g/dL — ABNORMAL LOW (ref 12.0–15.0)
MCH: 29.4 pg (ref 26.0–34.0)
MCHC: 33.7 g/dL (ref 30.0–36.0)
MCV: 87.5 fL (ref 80.0–100.0)
Platelets: 290 10*3/uL (ref 150–400)
RBC: 3.6 MIL/uL — ABNORMAL LOW (ref 3.87–5.11)
RDW: 16.1 % — ABNORMAL HIGH (ref 11.5–15.5)
WBC: 25.9 10*3/uL — ABNORMAL HIGH (ref 4.0–10.5)
nRBC: 0 % (ref 0.0–0.2)

## 2020-11-29 LAB — RPR: RPR Ser Ql: NONREACTIVE

## 2020-11-29 MED ORDER — KETOROLAC TROMETHAMINE 30 MG/ML IJ SOLN
30.0000 mg | Freq: Four times a day (QID) | INTRAMUSCULAR | Status: AC
  Administered 2020-11-29 (×2): 30 mg via INTRAVENOUS
  Filled 2020-11-29 (×2): qty 1

## 2020-11-29 MED ORDER — FERROUS SULFATE 325 (65 FE) MG PO TABS
325.0000 mg | ORAL_TABLET | Freq: Two times a day (BID) | ORAL | Status: DC
  Administered 2020-11-29 – 2020-11-30 (×3): 325 mg via ORAL
  Filled 2020-11-29 (×3): qty 1

## 2020-11-29 MED ORDER — IBUPROFEN 600 MG PO TABS
600.0000 mg | ORAL_TABLET | Freq: Four times a day (QID) | ORAL | Status: DC
  Administered 2020-11-29 – 2020-11-30 (×4): 600 mg via ORAL
  Filled 2020-11-29 (×4): qty 1

## 2020-11-29 MED ORDER — NIFEDIPINE ER OSMOTIC RELEASE 30 MG PO TB24
60.0000 mg | ORAL_TABLET | Freq: Every day | ORAL | Status: DC
  Administered 2020-11-30: 60 mg via ORAL
  Filled 2020-11-29: qty 2

## 2020-11-29 MED ORDER — INFLUENZA VAC SPLIT QUAD 0.5 ML IM SUSY
0.5000 mL | PREFILLED_SYRINGE | INTRAMUSCULAR | Status: AC
  Administered 2020-11-30: 0.5 mL via INTRAMUSCULAR
  Filled 2020-11-29 (×2): qty 0.5

## 2020-11-29 NOTE — Anesthesia Postprocedure Evaluation (Signed)
Anesthesia Post Note  Patient: Yvette Oliver  Procedure(s) Performed: [place]  Patient location during evaluation: Mother Baby Anesthesia Type: Epidural Level of consciousness: awake and alert Pain management: pain level controlled Vital Signs Assessment: post-procedure vital signs reviewed and stable Respiratory status: spontaneous breathing, nonlabored ventilation and respiratory function stable Cardiovascular status: stable Postop Assessment: no headache, no backache and epidural receding Anesthetic complications: no   No notable events documented.   Last Vitals:  Vitals:   11/29/20 0500 11/29/20 0801  BP:  122/89  Pulse: 95 87  Resp:  20  Temp:  36.6 C  SpO2: 95% 99%    Last Pain:  Vitals:   11/29/20 0801  TempSrc: Oral  PainSc:                  Hedda Slade

## 2020-11-29 NOTE — Progress Notes (Addendum)
Post Partum Day 1 Subjective: Doing well, no complaints.  Tolerating regular diet, pain with PO meds, voiding and ambulating without difficulty.  No CP SOB Fever,Chills, N/V or leg pain; denies nipple or breast pain, no HA change of vision, RUQ/epigastric pain  Objective: BP 122/89 (BP Location: Left Arm)   Pulse 87   Temp 97.8 F (36.6 C) (Oral)   Resp 20   Ht 5\' 3"  (1.6 m)   Wt 88.9 kg   SpO2 99%   Breastfeeding Unknown   BMI 34.72 kg/m    Physical Exam:  General: NAD Breasts: soft/nontender, no nipple abrasions/excoriation/bruising no engorgement, warm to touch, filling/firm CV: RRR Pulm: nl effort, CTABL Abdomen: soft, NT, BS x 4 Incision: Dsg CDI/Steristrips intact/no erythema or drainage Lochia: moderate Uterine Fundus: fundus firm and 1 fb below umbilicus DVT Evaluation: no cords, ttp LEs   Recent Labs    11/28/20 1555 11/29/20 0614  HGB 13.5 10.6*  HCT 39.4 31.5*  WBC 10.0 25.9*  PLT 323 290    Assessment/Plan: 35 y.o. G1P1001 postpartum day # 1  - Continue routine PP care - Lactation consult  - Discussed contraceptive options including implant, IUDs hormonal and non-hormonal, injection, pills/ring/patch, condoms, and NFP.  - Acute blood loss anemia - hemodynamically stable and asymptomatic; start po ferrous sulfate BID with stool softeners  - Immunization status: all Imms up to date    Disposition: Does not desire Dc home today.     Gertie Fey, CNM 11/29/2020 8:57 AM

## 2020-11-29 NOTE — Lactation Note (Signed)
This note was copied from a baby's chart. Lactation Consultation Note  Patient Name: Girl Haliegh Khurana [id number] Date: 11/29/2020 Reason for consult: Initial assessment;Primapara;Term;Breast reduction Age:34 hours  Initial lactation visit. Mom is P1, c-section 14 hours ago. Mom desires breastfeeding w/ feedings documented since birth, stool diapers, no wet. Mom has hx of breast reduction with nipple replacement. Scars present around the nipple/areolar, and underside of the breast. LC and mom spoke and mom understands the impact that this type of surgery can have on milk production and/or the ability for the nipples to release the milk if any of the ducts were severed during the surgery.  Mom reports that baby has come to the breast and stayed without becoming frustrated for most of the feedings, and is encouraged by that behavior.   Baby present at the breast in football position with good alignment and support by mom behind the head. Mom has a sandwich behind the areola and baby appears latched with rhythmic sucking pattern. Baby did come off once, nipples is not well everted, but tissue is compressible. LC assists with sandwiching the other side and mom and LC get baby back latched. Baby is relaxed at the breast, and does not appear agitated.   LC also present mom with the option of pump set-up for additional stimulation and possible milk removal in efforts to build supply and remove milk for baby. Mom open to pumping; set-up this afternoon.  Maternal Data Has patient been taught Hand Expression?: Yes Does the patient have breastfeeding experience prior to this delivery?: No  Feeding Mother's Current Feeding Choice: Breast Milk  LATCH Score Latch: Grasps breast easily, tongue down, lips flanged, rhythmical sucking.  Audible Swallowing: A few with stimulation  Type of Nipple: Everted at rest and after stimulation  Comfort (Breast/Nipple): Soft / non-tender  Hold (Positioning): No  assistance needed to correctly position infant at breast.  LATCH Score: 9   Lactation Tools Discussed/Used    Interventions Interventions: Breast feeding basics reviewed;Hand express;Support pillows;Education (breast reduction impact)  Discharge    Consult Status Consult Status: Follow-up Date: 11/29/20 Follow-up type: In-patient    Lavonia Drafts 11/29/2020, 10:42 AM

## 2020-11-29 NOTE — Anesthesia Post-op Follow-up Note (Signed)
  Anesthesia Pain Follow-up Note  Patient: Yvette Oliver  Day #: 1  Date of Follow-up: 11/29/2020 Time: 9:06 AM  Last Vitals:  Vitals:   11/29/20 0500 11/29/20 0801  BP:  122/89  Pulse: 95 87  Resp:  20  Temp:  36.6 C  SpO2: 95% 99%    Level of Consciousness: alert  Pain: none   Side Effects:None  Catheter Site Exam:clean, dry, no drainage  Anti-Coag Meds (From admission, onward)   Start     Dose/Rate Route Frequency Ordered Stop   11/29/20 2100  enoxaparin (LOVENOX) injection 40 mg        40 mg Subcutaneous Every 24 hours 11/28/20 2313         Plan: D/C from anesthesia care at surgeon's request  Graig Hessling Lorenza Chick

## 2020-11-29 NOTE — Progress Notes (Signed)
Went in pt to room to get pt up to ambulate in order to pull foley. Mom nursing at this time. Inst mom to call out when done feeding so we can ambulate. Pt verbalized understanding

## 2020-11-30 DIAGNOSIS — O1495 Unspecified pre-eclampsia, complicating the puerperium: Secondary | ICD-10-CM | POA: Diagnosis not present

## 2020-11-30 LAB — SURGICAL PATHOLOGY

## 2020-11-30 MED ORDER — SIMETHICONE 80 MG PO CHEW: 80.0000 mg | CHEWABLE_TABLET | ORAL | Status: AC | PRN

## 2020-11-30 MED ORDER — IBUPROFEN 600 MG PO TABS: 600.0000 mg | ORAL_TABLET | Freq: Four times a day (QID) | ORAL | Status: AC | PRN

## 2020-11-30 MED ORDER — SENNOSIDES-DOCUSATE SODIUM 8.6-50 MG PO TABS: 2.0000 | ORAL_TABLET | Freq: Every day | ORAL | Status: AC | PRN

## 2020-11-30 MED ORDER — ACETAMINOPHEN 500 MG PO TABS: 500.0000 mg | ORAL_TABLET | Freq: Four times a day (QID) | ORAL | Status: AC | PRN

## 2020-11-30 MED ORDER — COVID-19MRNA BIVAL VACC PFIZER 30 MCG/0.3ML IM SUSP
0.3000 mL | Freq: Once | INTRAMUSCULAR | Status: DC
  Filled 2020-11-30: qty 0.3

## 2020-11-30 MED ORDER — OXYCODONE HCL 5 MG PO TABS: 5.0000 mg | ORAL_TABLET | ORAL | 0 refills | Status: AC | PRN

## 2020-11-30 MED ORDER — NIFEDIPINE ER 60 MG PO TB24: 60.0000 mg | ORAL_TABLET | Freq: Every day | ORAL | 11 refills | Status: AC

## 2020-11-30 NOTE — Lactation Note (Signed)
This note was copied from a baby's chart. Lactation Consultation Note  Patient Name: Girl Dellene Mcgroarty [id number] Date: 11/30/2020 Reason for consult: Follow-up assessment;Primapara;Term;Breast reduction Age:34 hours  Lactation follow-up prior to anticipated discharge. Mom continued to BF overnight, with pumping and occasional supplement. Baby has voided and stooled above minimum expectations for HOL, bilirubin levels within acceptable range, and no overall concerns.   Reviewed with mom signs of contentment from the breast, ongoing stimulation post breastfeeding, paced-bottle feeding with supplement as needed, and appropriate volumes for supplement based on age.  Reviewed normal course of lactation, milk supply and demand, and possible anticipated breast changes and management.   Information given for outpatient services and community support. Encouraged to call with questions and for ongoing support as needed.  Maternal Data Has patient been taught Hand Expression?: Yes Does the patient have breastfeeding experience prior to this delivery?: No  Feeding Mother's Current Feeding Choice: Breast Milk Nipple Type: Slow - flow  LATCH Score                    Lactation Tools Discussed/Used Tools: Pump;Coconut oil Breast pump type: Double-Electric Breast Pump Reason for Pumping: stimulation/breast reduction  Interventions Interventions: Breast feeding basics reviewed;Hand express;Pre-pump if needed;DEBP;Hand pump;Pace feeding  Discharge Discharge Education: Engorgement and breast care;Warning signs for feeding baby;Outpatient recommendation Pump: Personal;Advised to call insurance company  Consult Status Consult Status: Complete Date: 11/30/20 Follow-up type: Call as needed    Lavonia Drafts 11/30/2020, 10:43 AM

## 2020-11-30 NOTE — Progress Notes (Signed)
Patient d/c home with infant. D/c instructions, Rx, and f/u appt given to and reviewed with pt. Pt verbalized understanding. Escorted out by auxillary.

## 2021-04-12 ENCOUNTER — Other Ambulatory Visit: Payer: Self-pay | Admitting: Obstetrics and Gynecology

## 2021-04-12 DIAGNOSIS — O9123 Nonpurulent mastitis associated with lactation: Secondary | ICD-10-CM

## 2021-04-12 DIAGNOSIS — L02412 Cutaneous abscess of left axilla: Secondary | ICD-10-CM

## 2021-04-13 ENCOUNTER — Other Ambulatory Visit: Payer: Self-pay | Admitting: Obstetrics and Gynecology

## 2021-04-13 DIAGNOSIS — L02412 Cutaneous abscess of left axilla: Secondary | ICD-10-CM

## 2021-04-13 DIAGNOSIS — O9123 Nonpurulent mastitis associated with lactation: Secondary | ICD-10-CM

## 2021-04-27 ENCOUNTER — Ambulatory Visit: Payer: 59

## 2021-04-27 ENCOUNTER — Other Ambulatory Visit: Payer: 59

## 2021-08-06 IMAGING — US US OB LIMITED
1 series · 1 of 1 positions shown · non-contrast
Comparison: Ultrasound 06/30/2020
COMPARISON: Ultrasound 06/30/2020

Addendum:
CLINICAL DATA: Vaginal bleeding, lower abdominal discomfort

EXAM:
LIMITED OBSTETRIC ULTRASOUND

[Series 1001: ob us · 1 of 1 slices shown]
[im 1/1]
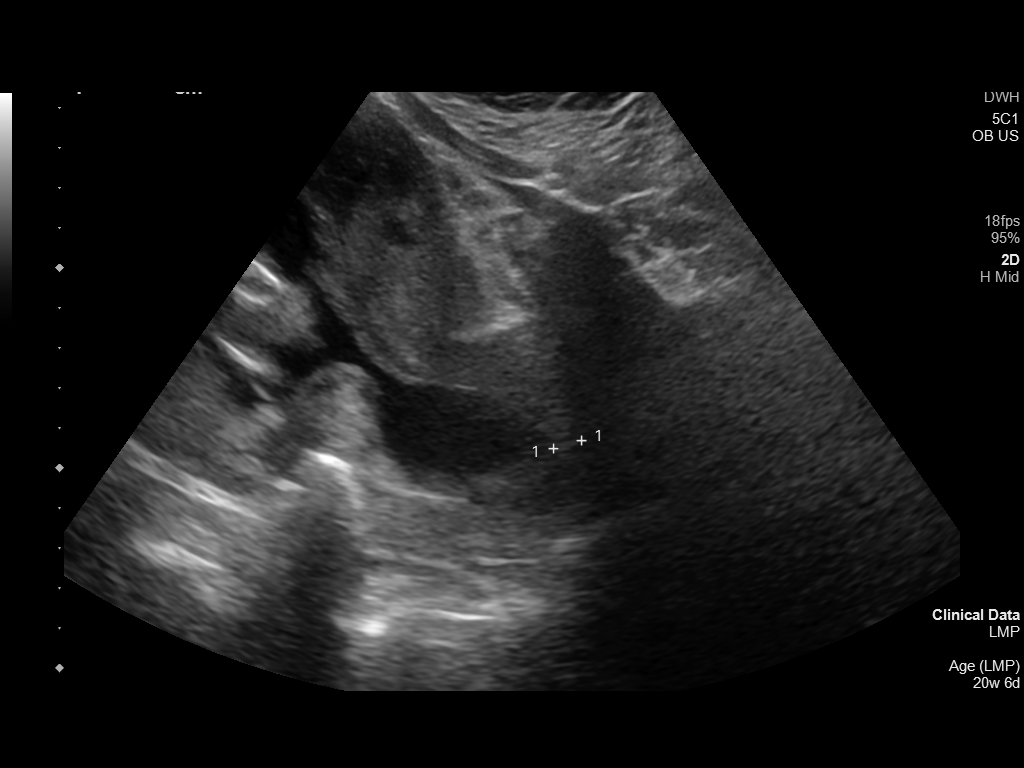

[1 of 1 positions shown; findings below may reference images not displayed]

FINDINGS: Number of Fetuses: 1

Heart Rate:  140 bpm

Movement: Yes

Presentation: Transverse, cranial position leftward

Placental Location: Posterior

Previa: No

Amniotic Fluid (Subjective):  Within normal limits.

BPD: 0.4 0.3 cm cm 19 w  0 d

MATERNAL FINDINGS:

Cervix:  Cervical funneling noted.

Uterus/Adnexae: Redemonstration of an anterior uterine fibroid
measuring 4.4 x 3.4 x 5.5 cm. An additional subserosal fibroid
measuring 3.7 x 3.0 x 4.6 cm is seen extending from the left uterine
fundus. A heterogeneous retroplacental focus within the myometrium
seen on comparison exam is no longer visible on today's examination.
No other focal uterine abnormality. Normal appearance of the left
ovary. Simple appearing 2.1 cm cyst in the right ovary.
IMPRESSION: 1. Single intrauterine gestation at 19 weeks, 0 days by BPD
sonographic estimation.
2. A previously seen heterogeneous myometrial focus subjacent to the
placenta is no longer evident on today's examination could reflect
resolution of hemorrhage.
3. Mild cervical funneling is noted on the exam. Consider
obstetrical consultation.
4. Multiple fibroids include an anterior 5.5 cm fibroid of the
uterine body, and a 4.6 cm subserosal appearing fibroid at the left
uterine fundus.
5. 2 cm simple appearing cyst in the right ovary. No followup
imaging recommended. Note: This recommendation does not apply to
premenarchal patients or to those with increased risk (genetic,
family history, elevated tumor markers or other high-risk factors)
of ovarian cancer. Reference: Radiology [DATE]):[id number].

This exam is performed on an emergent basis and does not
comprehensively evaluate fetal size, dating, or anatomy; follow-up
complete OB US should be considered if further fetal assessment is
warranted.

ADDENDUM:
Marked funneling of the internal cervical os with reduction of the
functional length of the cervix to approximately 7.3 mm.

Addendum will be called to the care team by the Radiologist
Assistant, and communication documented in the PACS or [REDACTED].

*** End of Addendum ***
FINDINGS: Number of Fetuses: 1

Heart Rate:  140 bpm

Movement: Yes

Presentation: Transverse, cranial position leftward

Placental Location: Posterior

Previa: No

Amniotic Fluid (Subjective):  Within normal limits.

BPD: 0.4 0.3 cm cm 19 w  0 d

MATERNAL FINDINGS:

Cervix:  Cervical funneling noted.

Uterus/Adnexae: Redemonstration of an anterior uterine fibroid
measuring 4.4 x 3.4 x 5.5 cm. An additional subserosal fibroid
measuring 3.7 x 3.0 x 4.6 cm is seen extending from the left uterine
fundus. A heterogeneous retroplacental focus within the myometrium
seen on comparison exam is no longer visible on today's examination.
No other focal uterine abnormality. Normal appearance of the left
ovary. Simple appearing 2.1 cm cyst in the right ovary.
IMPRESSION: 1. Single intrauterine gestation at 19 weeks, 0 days by BPD
sonographic estimation.
2. A previously seen heterogeneous myometrial focus subjacent to the
placenta is no longer evident on today's examination could reflect
resolution of hemorrhage.
3. Mild cervical funneling is noted on the exam. Consider
obstetrical consultation.
4. Multiple fibroids include an anterior 5.5 cm fibroid of the
uterine body, and a 4.6 cm subserosal appearing fibroid at the left
uterine fundus.
5. 2 cm simple appearing cyst in the right ovary. No followup
imaging recommended. Note: This recommendation does not apply to
premenarchal patients or to those with increased risk (genetic,
family history, elevated tumor markers or other high-risk factors)
of ovarian cancer. Reference: Radiology [DATE]):[id number].

This exam is performed on an emergent basis and does not
comprehensively evaluate fetal size, dating, or anatomy; follow-up
complete OB US should be considered if further fetal assessment is
warranted.

## 2021-08-07 IMAGING — US US OB LIMITED
1 series · 14 of 28 positions shown · non-contrast
Comparison: 07/06/2020

CLINICAL DATA: Follow-up cervix and AFI.

EXAM:
LIMITED OBSTETRIC ULTRASOUND

[Series 1: ob us · 29 acquisitions, 14 frames shown]
[im 2/29]
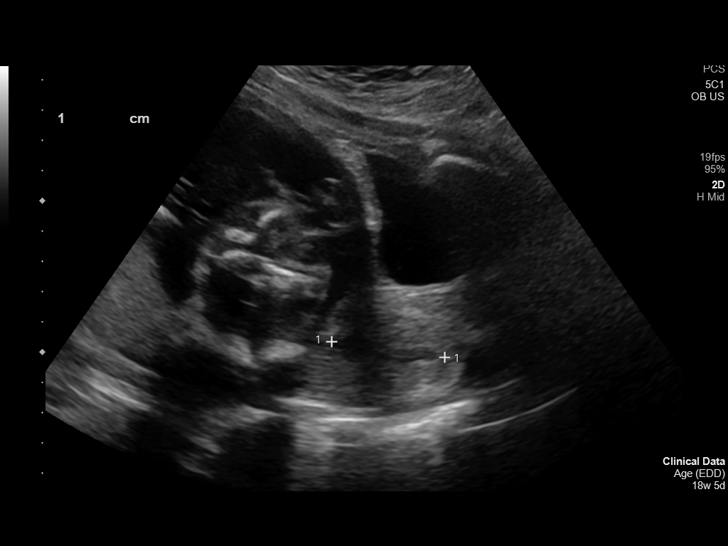
[im 4/29]
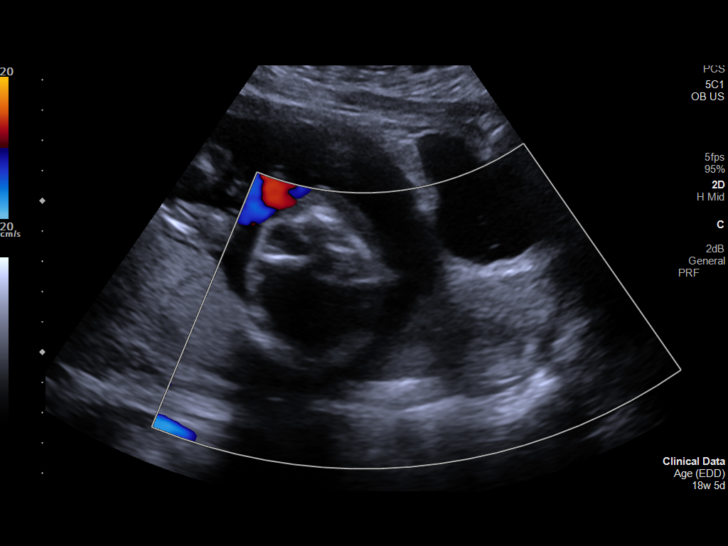
[im 6/29]
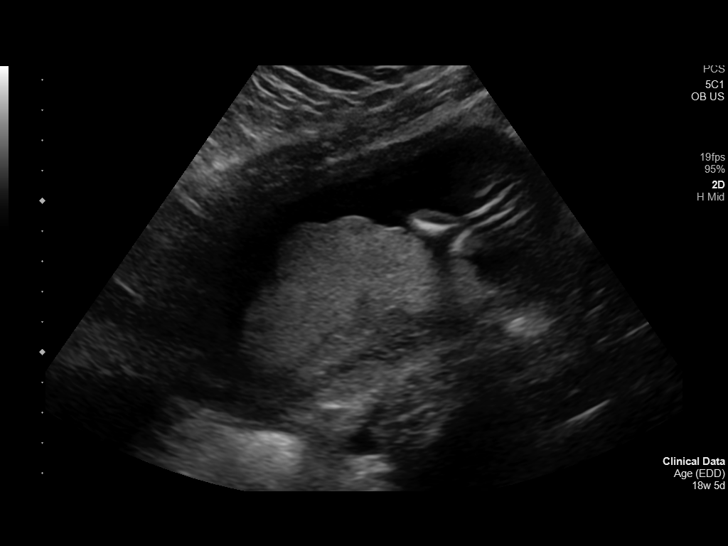
[im 8/29]
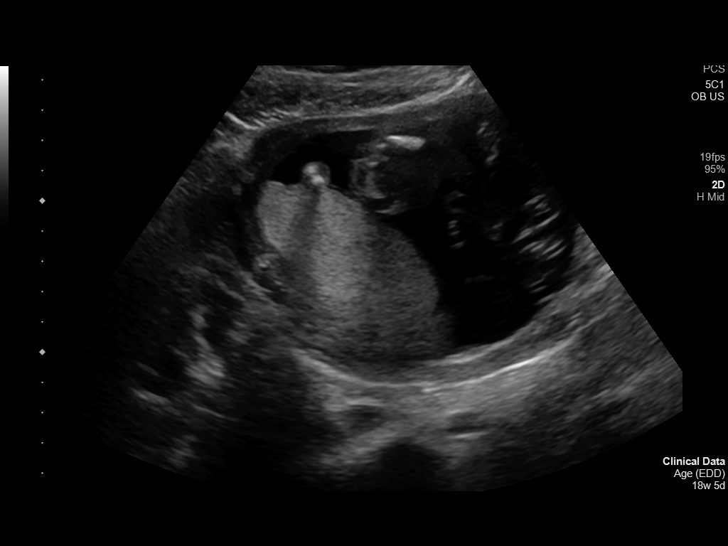
[im 10/29]
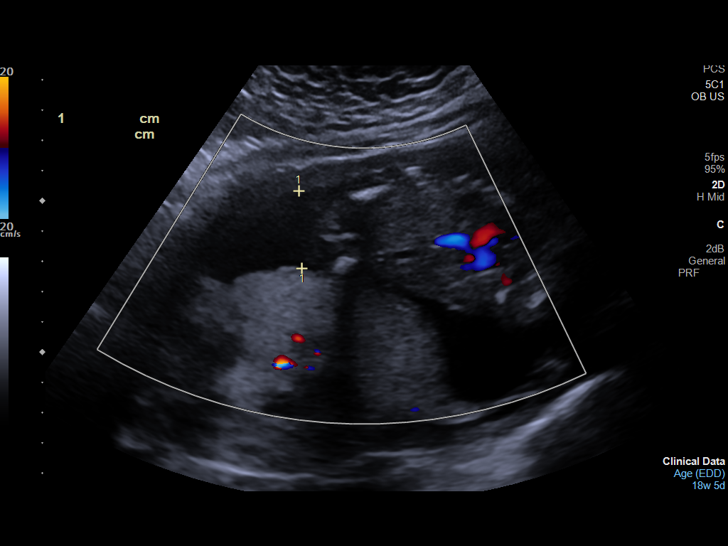
[im 12/29]
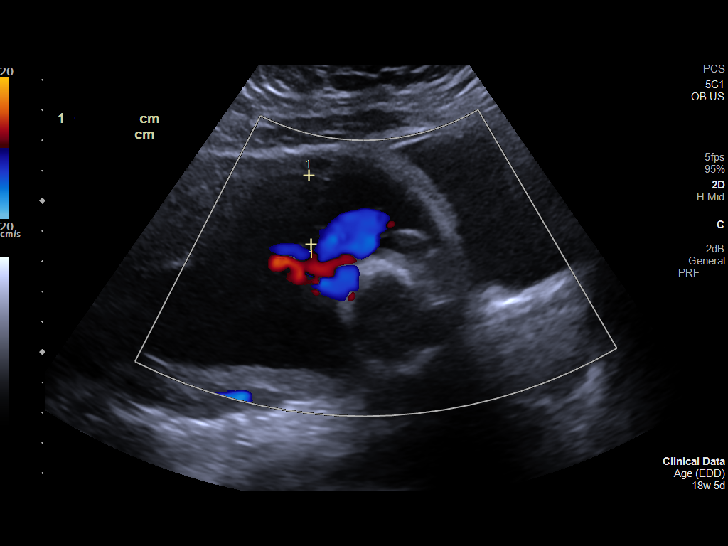
[im 14/29]
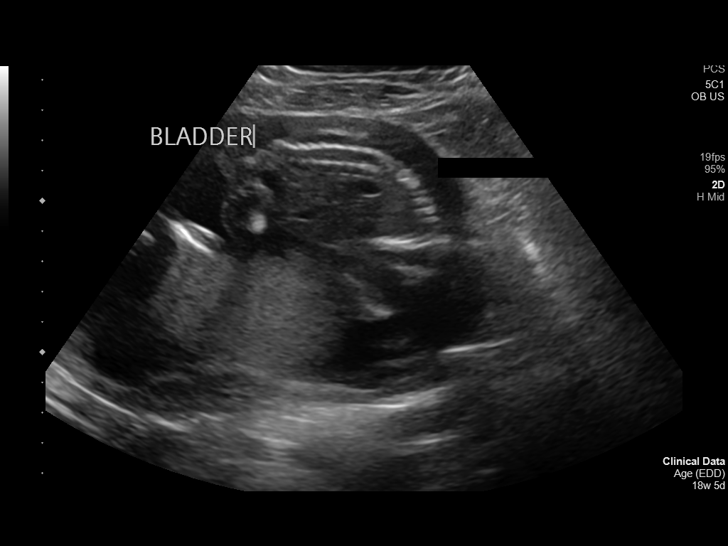
[im 16/29]
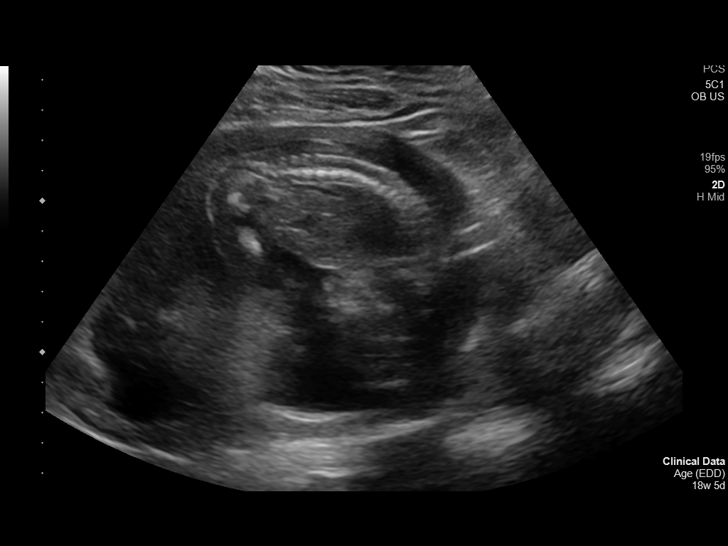
[im 18/29]
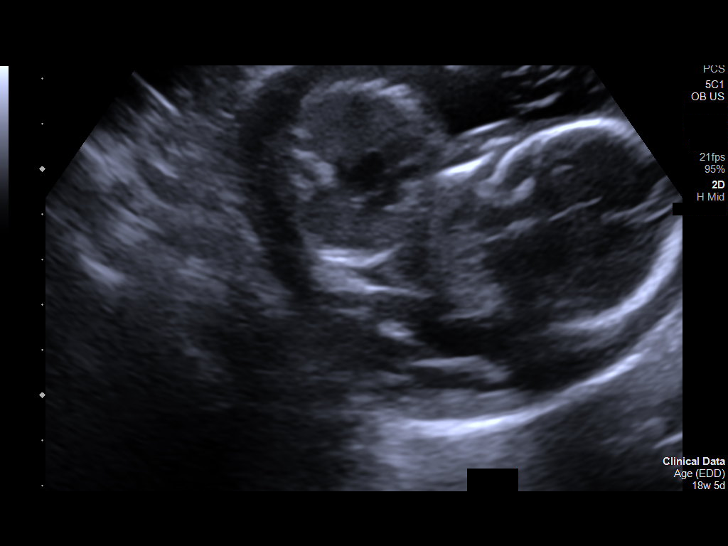
[im 20/29]
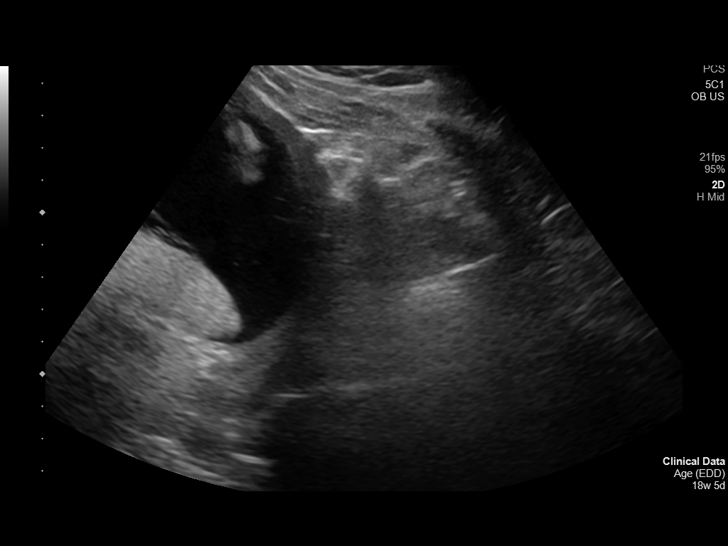
[im 22/29]
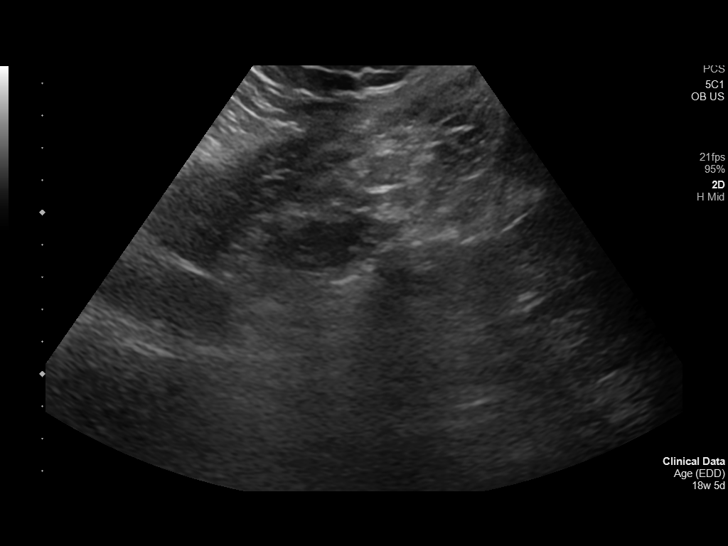
[im 24/29]
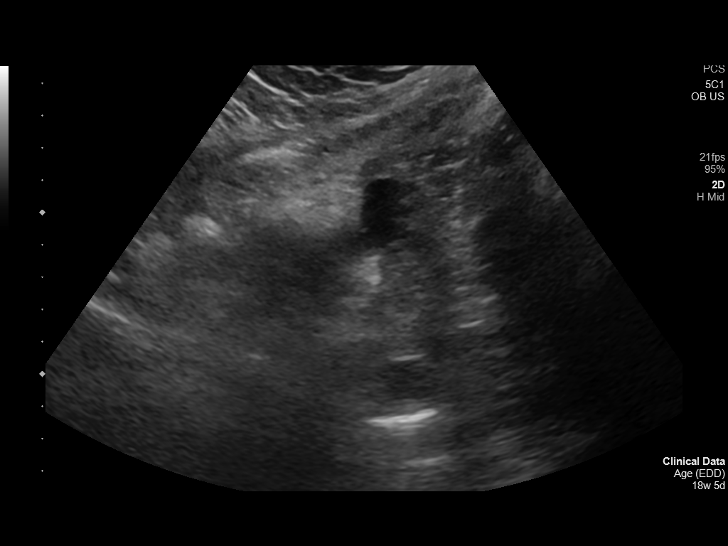
[im 26/29]
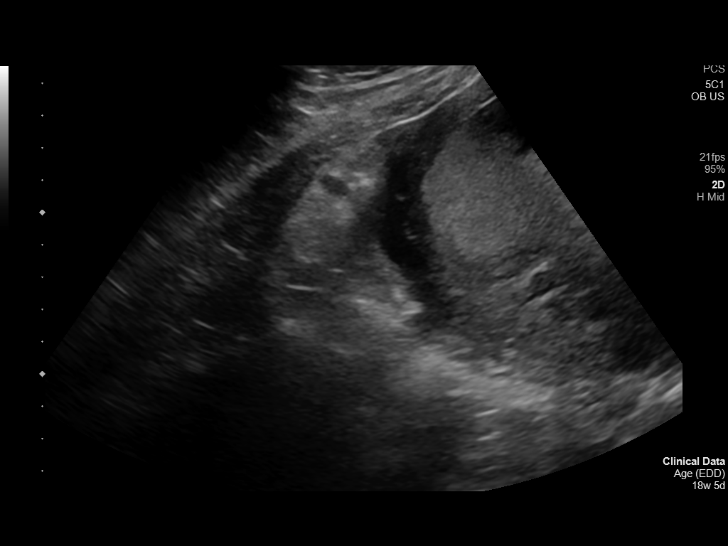
[im 29/29]
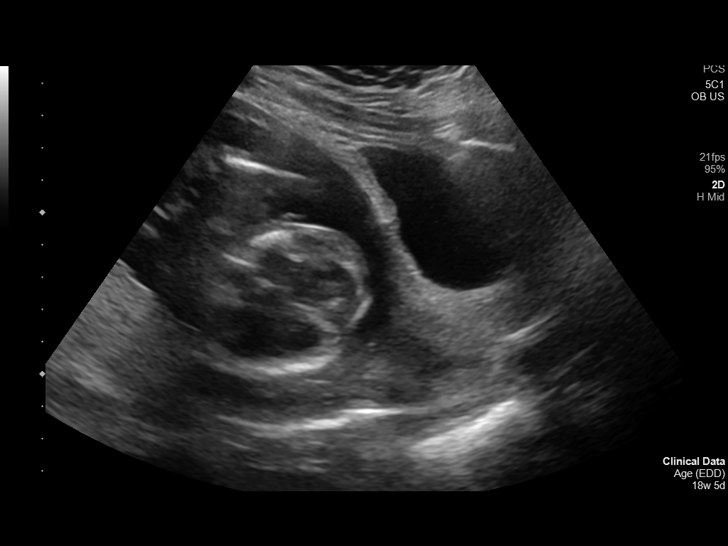

[14 of 28 positions shown; findings below may reference images not displayed]

FINDINGS: Number of Fetuses: 1

Heart Rate:  133 bpm

Movement: Yes

Presentation: Cephalic

Placental Location: Posterior

Previa: No

Amniotic Fluid (Subjective):  Within normal limits.

AFI: 11.3 cm

BPD: 4.4 cm 19 w  2 d

MATERNAL FINDINGS:

Cervix:  Appears closed.  Measures 3.8 cm.

Uterus/Adnexae: No abnormality visualized.
IMPRESSION: 1. Single living intrauterine gestation with an estimated
gestational age of 19 weeks and 2 days.
2. Cervix appears closed with a length of 3.8 cm.
3. Normal amniotic fluid with an AFI of 11.3 cm.

This exam is performed on an emergent basis and does not
comprehensively evaluate fetal size, dating, or anatomy; follow-up
complete OB US should be considered if further fetal assessment is
warranted.

## 2022-10-13 ENCOUNTER — Inpatient Hospital Stay
Admit: 2022-10-13 | Discharge: 2022-10-14 | Disposition: A | Discharge status: Home / Self Care | Payer: BLUE CROSS/BLUE SHIELD | Attending: Student in an Organized Health Care Education/Training Program

## 2022-10-13 ENCOUNTER — Emergency Department: Admit: 2022-10-14 | Payer: BLUE CROSS/BLUE SHIELD | Primary: Family

## 2022-10-13 DIAGNOSIS — H4710 Unspecified papilledema: Secondary | ICD-10-CM

## 2022-10-13 DIAGNOSIS — I10 Essential (primary) hypertension: Secondary | ICD-10-CM

## 2022-10-13 LAB — COMPREHENSIVE METABOLIC PANEL
ALT - Alanine Aminotransferase: 27 IU/L (ref 7–52)
AST - Aspartate Aminotransferase: 23 IU/L (ref 8–39)
Albumin/Globulin Ratio: 1.4 (ref >0.9)
Albumin: 4.3 g/dL (ref 3.5–5.0)
Alkaline Phosphatase: 66 IU/L (ref 34–104)
Anion Gap: 12 mmol/L (ref 4–13)
BUN: 10 mg/dL (ref 6–23)
Bilirubin Total: 0.4 mg/dL (ref 0.3–1.2)
CO2 - Carbon Dioxide: 25 mmol/L (ref 21–31)
Calcium: 9.2 mg/dL (ref 8.6–10.3)
Chloride: 102 mmol/L (ref 98–111)
Creatinine: 0.71 mg/dL (ref 0.55–1.10)
Globulin: 3.1 g/dL (ref 2.0–3.7)
Glomerular Filtration Rate Estimate (Female): >60 mL/min/{1.73_m2} (ref >=60)
Glucose: 87 mg/dL (ref 80–99)
Potassium: 3.9 mmol/L (ref 3.5–5.1)
Protein Total: 7.4 g/dL (ref 6.0–8.0)
Sodium: 139 mmol/L (ref 135–143)

## 2022-10-13 LAB — CBC WITH AUTO DIFFERENTIAL
Basophils %: 1 % (ref 0–2)
Basophils, Absolute: 0.1 10*3/ÂµL (ref 0.0–0.2)
Eosinophils %: 2 % (ref 0–7)
Eosinophils, Absolute: 0.2 10*3/ÂµL (ref 0.0–0.7)
HCT: 43.2 % (ref 37.0–48.0)
Hemoglobin: 14.6 g/dL (ref 12.0–16.0)
Lymphocytes %: 24 % — ABNORMAL LOW (ref 25–45)
Lymphocytes, Absolute: 3 10*3/ÂµL (ref 1.1–4.3)
MCH: 26.9 pg — ABNORMAL LOW (ref 27.0–34.0)
MCHC: 33.7 g/dL (ref 32.0–36.0)
MCV: 79.7 fL — ABNORMAL LOW (ref 81.0–99.0)
MPV: 7.6 fL (ref 7.4–10.4)
Monocytes %: 5 % (ref 0–12)
Monocytes, Absolute: 0.6 10*3/ÂµL (ref 0.0–1.2)
Neutrophils %: 68 % (ref 35–70)
Neutrophils, Absolute: 8.5 10*3/ÂµL — ABNORMAL HIGH (ref 1.6–7.3)
Platelet Count: 436 10*3/ÂµL — ABNORMAL HIGH (ref 150–400)
RBC: 5.43 10*6/ÂµL — ABNORMAL HIGH (ref 4.20–5.40)
RDW: 13.8 % (ref 11.5–14.5)
WBC: 12.5 10*3/ÂµL — ABNORMAL HIGH (ref 4.8–10.8)

## 2022-10-13 LAB — PREGNANCY SERUM ED: hCG Qualitative (Serum): NEGATIVE

## 2022-10-13 NOTE — ED Triage Notes (Addendum)
Pt presents to the ER as referral from [facility].  Provider concerned she has intercranial hypertension per pt.  She has blurry patch in L eye x 1 day.  Provider requesting LP and MRI.

## 2022-10-13 NOTE — Consults (Signed)
[facility] Neuro Note    # Demographics  Consult Type: General Neurology    Patient Location: Emergency Room    First Name: Regina    Last Name: Perez    Date of Birth: 1985-07-28    Age: 37    Gender: Female    Facility: [facility]    Time of Initial Page (Pacific Time): 10/13/2022, 19:57    Time of Return Call (Pacific Time): 10/13/2022, 19:57    # HPI  Chief Complaint:  - vision changes    History: 37 yo F was instructed by her ophthalmologist to come to the ER for IIH work up. 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 She developed painless L eye blurry vision since yesterday and went to see her ophthalmologist. She reports intermittent pressure like frontal headache, no tinnitus or whooshing sound. She's not on OCP or vit A derivatives.    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 Facility staff participated in the visit. I performed this telemedicine visit from my offsite office utilizing interactive 2 way audio and Primary school teacher.    Total time spent in telemedicine encounter: I spent 24 minutes reviewing clinical data and/or imaging, obtaining history, examining the patient, communicating with the onsite care team, and in preparation of this report.    # Demographics  First Name: Regina    Last Name: [name]    Facility: [facility]      Electronically signed at 10/13/2022 20:29 (Pacific Time) by Barnett Applebaum, MD

## 2022-10-13 NOTE — ED Notes (Signed)
Stroke robot at bedside for [facility] neurology consult

## 2022-10-13 NOTE — ED Notes (Signed)
MD at bedside, tech at bedside for lumbar puncture.

## 2022-10-13 NOTE — ED Provider Notes (Signed)
[facility] DEPART[facility] VISIT NOTE    History and Physical     Name: Regina Perez  DOB: 09-27-1985 36 y.o.  MRN: [id number]  CSN: [id number]    HISTORY:     CHIEF COMPLAINT    Referral    HPI    Regina Perez is a 37 y.o. female without significant PMHx, presenting because her ophthalmologist told her to come here.  Per triage documentation, provider is concerned the patient has intracranial hypertension.  She has been having blurry vision in the left eye for 1 day.  Her ophthalmologist is requesting LP and MRI.  Patient states that she has a hemorrhage in the left eye because of her hypertension.  The ophthalmologist dilated her eyes.  Her exam today, she is complaining of mild left eye pain and sensitivity.    [facility] note from today reviewed.  They noted blurred disc margins with disc hyperemia and elevated nerve head bilaterally.  He also noticed an intraretinal hemorrhage along the left eye/T arcade.  They expressed concern for idiopathic intracranial hypertension.  They are requesting that patient get an urgent MRI/MRV with contrast as well as lumbar puncture with opening pressure and CSF analysis.  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    History was obtained from patient. On initial assessment, patient states that she woke up with the blurry vision yesterday.  She has had intermittent hypertension at her various doctors appointments since May of this year, but she has been unable to follow-up with a primary care provider because she no longer has one.  She reports intermittent headaches, approximately 2/week, secondary to her stressful job.  She denies any other symptoms including pain anywhere, numbness/tingling/weakness of her arms and legs, difficulty ambulating.  She states that she feels otherwise well.    PAST MEDICAL HISTORY    History reviewed. No pertinent past medical history.    SURGICAL  HISTORY    History reviewed. No pertinent surgical history.    HOME MEDICATION LIST    Home Medications       Med List Status: Initial Review Done Set By: Rayburn Ma, RN at 10/13/2022  2:48 PM   No medications reported.           ALLERGIES    Patient has no known allergies.    SOCIAL HISTORY    Social History     Tobacco Use    Smoking status: Passive Smoke Exposure - Never Smoker    Smokeless tobacco: Never   Substance Use Topics    Alcohol use: Yes     Comment: rarely    Drug use: No       PHYSICAL EXAM:   INITIAL VITAL SIGNS:    Initial Vitals [10/13/22 1206]   BP (!) 169/99   Pulse 101   Resp 16   Temp 36.9 ?C (98.4 ?F)   SpO2 97 %       General:  nontoxic, in no acute distress  HEENT:  Normocephalic, Atraumatic. Pupils artificially dilated, equal. EOM intact without nystagmus. Visual acuity L 20/70, R 20/20    Neck: Supple.   Chest:  Normal respiratory effort. Clear lung sounds bilaterally  Cardiovascular:  Regular rate and rhythm.   GI:  Soft, non-tender.   Extremities: Warm, well perfused. No gross deformities  Skin:  No rashes or bruising noted.   Neurologic:  awake and alert, 5/5 strength in all 4  extremities. Sensation intact. No focal deficits appreciated  Psychiatric: Mood and affect normal.    ASSESSMENT and PLAN and INITIAL MEDICAL DECISION MAKING:  Regina Perez is a 37 y.o. female without significant PMHx, presenting for concern for intracranial hypertension.  Differential diagnosis includes but is not limited to IIH, brain mass, stroke, uncontrolled hypertension.    Patient is hypertensive to 169/99 on arrival.    Given ophthalmology's findings earlier today, concern for IIH is high.  Will obtain MRI, MRV with and without contrast, basic labs, serum pregnancy.  Will plan for LP if MRI imaging is unremarkable.    RESULTS :     LABS    Results for orders placed or performed during the hospital encounter of 10/13/22 (from the past 24 hour(s))   CBC with Auto Differential -STAT     Collection Time: 10/13/22  3:30 PM   Result Value Ref Range    WBC 12.5 (H) 4.8 - 10.8 10*3/?L    RBC 5.43 (H) 4.20 - 5.40 10*6/?L    Hemoglobin 14.6 12.0 - 16.0 g/dL    HCT 14.7 82.9 - 56.2 %    MCV 79.7 (L) 81.0 - 99.0 fL    MCH 26.9 (L) 27.0 - 34.0 pg    MCHC 33.7 32.0 - 36.0 g/dL    RDW 13.0 86.5 - 78.4 %    Platelet Count 436 (H) 150 - 400 10*3/?L    MPV 7.6 7.4 - 10.4 fL    Neutrophils % 68 35 - 70 %    Lymphocytes % 24 (L) 25 - 45 %    Monocytes % 5 0 - 12 %    Eosinophils % 2 0 - 7 %    Basophils % 1 0 - 2 %    Neutrophils, Absolute 8.5 (H) 1.6 - 7.3 10*3/?L    Lymphocytes, Absolute 3.0 1.1 - 4.3 10*3/?L    Monocytes, Absolute 0.6 0.0 - 1.2 10*3/?L    Eosinophils, Absolute 0.2 0.0 - 0.7 10*3/?L    Basophils, Absolute 0.1 0.0 - 0.2 10*3/?L    Differential Type Automated Differential    Comprehensive Metabolic Panel -STAT    Collection Time: 10/13/22  3:30 PM   Result Value Ref Range    Sodium 139 135 - 143 mmol/L    Potassium 3.9 3.5 - 5.1 mmol/L    Chloride 102 98 - 111 mmol/L    CO2 - Carbon Dioxide 25 21 - 31 mmol/L    Glucose 87 80 - 99 mg/dL    BUN 10 6 - 23 mg/dL    Creatinine 6.96 2.95 - 1.10 mg/dL    Calcium 9.2 8.6 - 28.4 mg/dL    AST - Aspartate Aminotransferase 23 8 - 39 IU/L    ALT - Alanine Aminotransferase 27 7 - 52 IU/L    Alkaline Phosphatase 66 34 - 104 IU/L    Bilirubin Total 0.4 0.3 - 1.2 mg/dL    Protein Total 7.4 6.0 - 8.0 g/dL    Albumin 4.3 3.5 - 5.0 g/dL    Globulin 3.1 2.0 - 3.7 g/dL    Albumin/Globulin Ratio 1.4 >0.9    Anion Gap 12 4 - 13 mmol/L    Glomerular Filtration Rate Estimate (Female) >60 >=60 mL/min/1.91m*2    GFR Additional Info     Pregnancy Serum ED -STAT    Collection Time: 10/13/22  3:30 PM   Result Value Ref Range    hCG Qualitative (Serum) Negative Negative   PT & PTT -STAT  Collection Time: 10/13/22  8:33 PM   Result Value Ref Range    Protime 12.3 10.0 - 13.0 Seconds    INR 1.1 0.9 - 1.2    APTT 35 23 - 36 Seconds   Glucose, CSF -Once    Collection Time: 10/13/22  10:51 PM   Result Value Ref Range    Glucose CSF 57 50 - 80 mg/dL   Protein, Total (CSF) -Once    Collection Time: 10/13/22 10:51 PM   Result Value Ref Range    Protein Total CSF 26 15 - 60 mg/dL   CSF Cell Count ED Panel 1 -Once    Collection Time: 10/13/22 10:51 PM   Result Value Ref Range    Tube Number, CSF 1     Tube Volume, CSF 4.00 mL    Color, CSF Colorless Colorless    Appearance, CSF Clear Clear    RBC Count, CSF 8 (H) 0 /cumm    TNC Count (inc WBC), CSF 2 <5 /cumm   CSF Cell Count ED Panel 2 w/Reflex Differential -Once    Collection Time: 10/13/22 10:51 PM   Result Value Ref Range    Tube Number, CSF 3     Tube Volume, CSF 3.00 mL    Color, CSF Colorless Colorless    Appearance, CSF Clear Clear    RBC Count, CSF 1 (H) 0 /cumm    TNC Count (inc WBC), CSF 1 <5 /cumm       RADIOLOGY/PROCEDURES    MRI venogram head with and without contrast   Final Result by Interface, Rad Results In (08/09 1934)   IMPRESSION:   No findings concerning for venous sinus thrombosis.      RADIA      Dictated By: Glee Arvin MD 2022-10-13 19:33:40.26   Signed By: Glee Arvin MD 2022-10-13 19:34:41.0   Transcribed By: Glee Arvin 2022-10-13 19:34:41.783         SITE ID: [id number]   Patients are advised to discuss imaging findings and recommendations with the ordering provider.      MRI brain with and without contrast   Final Result by Interface, Rad Results In (08/09 1941)   IMPRESSION:         1. 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   2. Otherwise unremarkable exam.   3. No acute intracranial findings.      RADIA      Dictated By: Heide Scales MD 2022-10-13 19:40:53.983   Signed By: Heide Scales MD 2022-10-13 19:41:56.0   Transcribed By: Heide Scales 2022-10-13 19:41:56.033         SITE ID: [id number]   Patients are advised to discuss imaging findings and recommendations  with the ordering provider.      Fluoroscopy guided lumbar puncture diagnostic    (Results Pending)         ED COURSE & FURTHER MEDICAL DECISION MAKING    Data results reviewed. Nursing note reviewed.      Labs reviewed.  Patient has a mild leukocytosis of 12.5.  Coags unremarkable.  Electrolytes within normal limits.  MRI brain and venogram are consistent with idiopathic intracranial hypertension, with papilledema, partially empty sella, stenotic bilateral transverse dural sinuses.     I discussed the case with neurology, who was consulted.  They are recommending Diamox 250 mg twice daily with close neuro and Optho follow-up.  They also recommend starting an antihypertensive, given patient's uncontrolled hypertension.    LP was performed:  Written consent was obtained.  Area was cleaned with Betadine solution.  Area was anesthetized with lidocaine.  I attempted to access the CSF but was unsuccessful,due to difficult landmarks given patient's body habitus.  Patient tolerated the procedure well without any immediate complications.  I will consult IR for fluoroscopy-guided LP.    I discussed the case with IR, they state the opening pressure was 27 and it yielded 12 cc of clear CSF. this is consistent with intracranial hypertension.    CSF fluid is negative for infection.  Cultures pending.    Patient reassessed 1 hour after LP, she is doing well, tolerated the procedure without immediate complication.  Patient is stable for discharge home with prescription for Diamox, amlodipine, referral to PCP and neurology.  Patient has follow-up scheduled with ophthalmology this week.  Patient discharged with close return precautions.    DISPOSITION:  As above.    CLINICAL IMPRESSION:    SNOMED CT(R)   1. Papilledema  OPTIC DISC EDEMA   2. Idiopathic intracranial hypertension  BENIGN INTRACRANIAL HYPERTENSION              Some or all of this note may have been documented with voice recognition software. Please contact me for  clarification of any suspected documentation errors.           Earlean Shawl, MD         Ander Slade, MD  10/14/22 [phone]

## 2022-10-13 NOTE — ED Notes (Signed)
Pt states that she has a hemorrhage in L eye, and hypertension. Pt states that all of her s/sx started yesterday, but she has had broken blood vessels in her eyes. C/o blurry spot in left eye. Pt states that he has had HTN since May.

## 2022-10-13 NOTE — Discharge Instructions (Addendum)
Your MRI and lumbar puncture were consistent with idiopathic intracranial hypertension. Start taking the diamox for the IIH, and amlodipine for your blood pressure. Avoid vitamin A supplementation. Follow up closely with the neurologist and with a primary care provider. You have been referred to both of these providers. Follow up as scheduled with your ophthalmologist.  Return to the ER if your symptoms worsen or persist, or if you develop new or concerning symptoms.

## 2022-10-13 NOTE — ED Notes (Signed)
Consent signed for LP after MD explanation of risks versus benefits. Witnessed by [employer]

## 2022-10-13 NOTE — ED Notes (Signed)
Pt states ophthalmologist dilated her eyes for exam today, c/o mild left eye pain and sensitivity.

## 2022-10-14 LAB — CSF CULTURE
Culture Result: NO GROWTH
Gram Stain: NONE SEEN
Gram Stain: NONE SEEN
Gram Stain: NONE SEEN

## 2022-10-14 LAB — CSF ED PANEL 1
RBC Count, CSF: 8 /mm3 — ABNORMAL HIGH
TNC Count (inc WBC), CSF: 2 /mm3 (ref <5)
Tube Number, CSF: 1
Tube Volume, CSF: 4 mL

## 2022-10-14 LAB — CSF ED PANEL 2 W/REFLEX DIFFERENTIAL
RBC Count, CSF: 1 /mm3 — ABNORMAL HIGH
TNC Count (inc WBC), CSF: 1 /mm3 (ref <5)
Tube Number, CSF: 3
Tube Volume, CSF: 3 mL

## 2022-10-14 LAB — PT & PTT
APTT: 35 Seconds (ref 23–36)
INR: 1.1 (ref 0.9–1.2)
Protime: 12.3 Seconds (ref 10.0–13.0)

## 2022-10-14 LAB — PROTEIN, CSF: Protein Total CSF: 26 mg/dL (ref 15–60)

## 2022-10-14 LAB — GLUCOSE, CSF: Glucose CSF: 57 mg/dL (ref 50–80)

## 2022-10-14 MED ORDER — gadopiclenol (VUEWAY) injection 10 mL
0.5 | Freq: Once | INTRAVENOUS | Status: AC
Start: 2022-10-14 — End: 2022-10-13
  Administered 2022-10-14: 02:00:00 0.5 mL via INTRAVENOUS

## 2022-10-14 MED ORDER — amLODIPine (NORVASC) 5 mg tablet
5 | ORAL_TABLET | Freq: Every day | ORAL | 0 refills | 90.00000 days | Status: AC
Start: 2022-10-14 — End: 2023-11-22

## 2022-10-14 MED ORDER — acetaZOLAMIDE (DIAMOX) 250 mg tablet
250 | ORAL_TABLET | Freq: Two times a day (BID) | ORAL | 0 refills | Status: AC
Start: 2022-10-14 — End: 2023-11-22

## 2022-10-16 NOTE — Telephone Encounter (Signed)
Care call to patient after having lumbar puncture. She reports that she is doing good. Sore at puncture site, but denies headaches. No other questions or concerns at this time.

## 2022-10-26 NOTE — Telephone Encounter (Signed)
ED CM consult received for patient having hard time w/ refills. Per chart review, patient seen at [facility] ED 8/9 for papilledema and idiopathic intracranial hypertension.  Patient had discharge referral to [facility] and [facility]. Attempt to reach patient via phone, left voice mail with instruction to contact [facility] but they may not be taking new patients. If not, got to her insurance website for a listing of in-network providers to contact for PCP establishment. Callback number also provided.

## 2022-11-20 ENCOUNTER — Ambulatory Visit: Admit: 2022-11-20 | Discharge: 2022-11-20 | Payer: BLUE CROSS/BLUE SHIELD | Attending: Neurology | Primary: Family

## 2022-11-20 DIAGNOSIS — G932 Benign intracranial hypertension: Secondary | ICD-10-CM

## 2022-11-20 NOTE — Telephone Encounter (Signed)
Patient states that she follows with a Dr. Massie Kluver, MD of [facility]).  Can I please request we obtain patient's previous records for my review? Thank you

## 2022-11-20 NOTE — Progress Notes (Addendum)
NEUROLOGY CLINIC H&P    Name: Regina Perez    Date: 11/20/2022    MRN: [id number]    Patient's PCP: Roxine Caddy, NP      REASON FOR VISIT: ER referral with concerns for intracranial hypertension    HISTORY OF PRESENT ILLNESS: Regina Perez is a 37 y.o. female with no significant past medical history on file who presents to our neurology clinic on 9/16 as an emergency room referral with concerns for intracranial hypertension.     Patient was recently evaluated at [facility] emergency room on 8/9 at the request of her ophthalmologist.  Documentation reviewed.  Per emergency room documentation, patient's outpatient ophthalmologist was concerned for intracranial hypertension after patient initially presented with complaints of blurry vision.  I am currently unable to review her ophthalmologist records directly, yet per emergency room records, patient was noted to have blurred disc margins with disc hyperemia as well as intraretinal hemorrhage of the left eye with constellation of findings concerning for intracranial hypertension.  Additionally noted retinal aneurysm discerning for systemic hypertension.  She was sent to the emergency room for urgent MRI/MRV as well as lumbar puncture.     Systolic blood pressures in the 160s to 170s on presentation.  Chemistries noncontributory.  CBC with platelet count of 436.  WBC count: 12.5.  MRI brain and MRV brain were completed and directly reviewed.  MRI showing papilledema, empty sella, and stenotic bilateral transverse venous sinuses consistent with IIH.  MRV with no evidence of venous sinus thrombosis.  Patient underwent LP with opening pressure of 27.  CSF panel unremarkable.  Patient was communicated with teleneurology with recommendations for Diamox as treatment.  She was started on amlodipine for hypertension.  Was discharged with recommendations for neurology, ophthalmology, and PCP follow-up.     Patient describes relatively acute onset blurry vision  involving the central aspect of just her left eye which began a few days prior to emergency room presentation.  Since receiving lumbar puncture and starting Diamox, she believes that her symptoms have almost completely resolved.  She describes a prior history of daily chronic headaches, yet since going to the hospital, she says that her headaches are now gone.  Headaches previously described as pressure-like sensation.  Global in distribution.  Started on lisinopril in addition to amlodipine by her PCP this past Thursday.     She provides the name of Dr. Massie Kluver as her current ophthalmologist.     PAST MEDICAL HISTORY:   Past Medical History:   Diagnosis Date    Headache 2022    High blood pressure 10/13/2022    Idiopathic intercranial hypertension     PAST SURGICAL HISTORY:   Past Surgical History:   Procedure Laterality Date    FL GUIDED LUMBAR PUNCTURE DIAGNOSTIC  10/13/2022    FL GUIDED LUMBAR PUNCTURE DIAGNOSTIC 10/13/2022 Regina Anes, MD [facility]     FAMILY MEDICAL HISTORY:   Family History   Problem Relation Age of Onset    Cancer Mother 68        Thyroid cancer    COPD Mother     Thyroid disease Mother     Arthritis Mother     Diabetes Mother     Heart disease Mother     High Blood Pressure Mother     No Known Health Problems Sister     No Known Health Problems Brother      SOCIAL HISTORY:   Social History     Socioeconomic History  Marital status: Single   Tobacco Use    Smoking status: Never     Passive exposure: Yes    Smokeless tobacco: Never   Substance and Sexual Activity    Alcohol use: Yes     Comment: rarely    Drug use: Never    Sexual activity: Yes     Partners: Male     Birth control/protection: Condom, None   Other Topics Concern    Caffeine Concern No    Exercise No    Military Service No     Allergy: No Known Allergies  MEDS:   Current Outpatient Medications   Medication Sig Dispense Refill    acetaZOLAMIDE (DIAMOX) 250 mg tablet Take 1 tablet by mouth 2 times daily. 60  tablet 0    amLODIPine (NORVASC) 5 mg tablet Take 1 tablet by mouth once daily. 30 tablet 0    APPLE CIDER VINEGAR ORAL Take 450 mg by mouth once daily.      cholecalciferol (VITAMIN D3) 25 mcg (1,000 unit) tablet Take 1,000 units by mouth once daily.      ELDERBERRY FRUIT ORAL Take 500 mg by mouth once daily.      lisinopriL (ZESTRIL) 20 mg tablet Take 20 mg by mouth once daily.      quercetin 500 mg Cap Take 1 capsule by mouth once daily.       No current facility-administered medications for this visit.       COMPLETE REVIEW OF SYSTEMS NEGATIVE UNLESS STATED ABOVE.    PHYSICAL EXAM  BP 126/90 (BP Location: Left arm, Patient Position: Sitting)   Pulse 83   Temp 36.5 ?C (97.7 ?F) (Temporal)   Ht 5' 9 (1.753 m)   Wt (!) 303 lb (137.4 kg)   SpO2 96%   BMI 44.75 kg/m?     GENERAL APPEARANCE: Alert, well-developed, well-nourished female in no acute distress. Obese  HEENT: Normocephalic and atraumatic. PERRL. Oropharynx unremarkable.  PULM: Normal respiratory effort. No accessory muscle use.  EXTREMITIES: No obvious signs of vascular compromise. No cyanosis or clubbing.  SKIN: Clear; no rashes, lesions or skin breaks in exposed areas.    NEURO:  MENTAL STATUS: Patient awake and oriented to time, place, and person.      CRANIAL NERVES  CNI: Not tested  CNII: Visual fields full  CNIII, IV, VI: Pupils equal, round and reactive to light and accommodation; extraocular movements are full and intact.  CNV: Facial sensation normal.  CNVII: Hearing grossly normal and equal bilaterally.  No skew deviation or pathological nystagmus.  CNIX, X: Palate elevates symmetrically.  CNXI: Shoulder shrug and chin rotation equal with good strength.  CN XII: Tongue protrusion midline.    MOTOR:  Normal bulk.  Tone normal and symmetrical throughout.  No abnormal movements.  No tremor.  Strength 5/5 throughout   REFLEXES: Not assessed  SENSATION: Sensation grossly intact to fine touch  COORDINATION: Finger-to-nose normal for  age      DIAGNOSTIC TESTS              Lab Results   Component Value Date    INR 1.1 10/13/2022     No results found for: CHOLTOT, TRIG, HDL, CHOLHDL, LDL  No results found for: HGBA1C  Lab Results   Component Value Date    GLUCCSF 57 10/13/2022       ASSESSMENT:  Patient Active Problem List   Diagnosis SNOMED CT(R)    Passive smoke exposure PASSIVE SMOKER  Jameila was seen today for idiopathic intracranial hypertension.    Diagnoses and all orders for this visit:    IIH (idiopathic intracranial hypertension)    Hypertension, unspecified type        PLAN  Idiopathic intracranial hypertension  Prior workup:  MRI brain 10/13/22: With papilledema, empty sella, and stenotic bilateral transverse dural venous sinuses consistent with IIH  MRV 10/13/22: Negative for Dural sinus thrombosis  CSF OP: 27cm H20  She has been started on Diamox 250 mg twice daily and states that her symptoms have almost completely resolved.  Denies any apparent adverse effects with medication.  Continue Diamox 250 mg twice daily  Extensive time spent educating patient of benefits of weight loss.  Goal of 6 to 10% body weight loss.  She will follow-up with her PCP for further care  She is to maintain close follow-up with her ophthalmology team.  Will ask my office to obtain prior ophthalmology records for review  She has been advised that should she experience any progression of vision loss, she is to go to the hospital for immediate assessment  She may follow-up in our neurology clinic in approximately 6 months    2.  Hypertension  Management per PCP  Currently maintained on amlodipine as well as lisinopril which was started last week.  Systolic blood pressures in the 120s today  Weight loss advised      Total time spent on today's visit was 45 minutes. I spent 25 minutes face to face with the patient, performance of medically appropriate history and examination as well as education and counseling. I spent 20 minutes in preparation  for the visit (I.e., reviewing charts and test results), orders for medications, tests or other procedures, and coordination of care.    **This document was created with the assistance of voice-to-text technology. Effort has been made to minimize transcription errors. Please allow for homonyms and other similar transcription errors, such as is for has, she in place of he and vice versa.**     Delice Bison, DO  [facility])  [address]  [place], [place] [address]  Phone #: [phone]  Fax #: [phone]    11/20/22

## 2022-11-20 NOTE — Patient Instructions (Signed)
Continue with Diamox 250mg  BID (twice a day)  Go to nearest hospital with any acute change in vision  Follow up with eye doctors  Follow up with PCP. Weight loss advised (about 6-10% body weight)  Follow up with Neuro clinic in 6 months.

## 2022-11-24 NOTE — Telephone Encounter (Signed)
[facility] and they will be faxing over the patient's medical records for provider's review.

## 2022-11-28 NOTE — Telephone Encounter (Signed)
Report received and scanned into chart under Media tab for provider review.

## 2023-05-21 ENCOUNTER — Ambulatory Visit: Admit: 2023-05-21 | Discharge: 2023-05-21 | Payer: BLUE CROSS/BLUE SHIELD | Attending: Neurology | Primary: Family

## 2023-05-21 VITALS — BP 132/90 | HR 106 | Temp 98.10000°F | Ht 69.0 in | Wt 274.0 lb

## 2023-05-21 DIAGNOSIS — G932 Benign intracranial hypertension: Secondary | ICD-10-CM

## 2023-05-21 NOTE — Progress Notes (Signed)
 NEUROLOGY PROGRESS NOTE    Name: Regina Perez    Date: 05/21/2023    MRN: [id number]    Patient's PCP: Regina Caddy, NP      REASON FOR VISIT/CHIEF COMPLAINT: IIH follow-up    HISTORY OF PRESENT ILLNESS: Regina Perez is a 38 y.o. female with past medical history of idiopathic intracranial hypertension who presents to our neurology clinic on 05/21/2023 as a follow-up for idiopathic intracranial hypertension.     Patient was first seen by myself on 11/20/2023 as an emergency room referral with concerns for idiopathic intracranial hypertension.     Patient was recently evaluated at [facility] emergency room on 8/9 at the request of her ophthalmologist.  Documentation reviewed.  Per emergency room documentation, patient's outpatient ophthalmologist was concerned for intracranial hypertension after patient initially presented with complaints of blurry vision.  I am currently unable to review her ophthalmologist records directly, yet per emergency room records, patient was noted to have blurred disc margins with disc hyperemia as well as intraretinal hemorrhage of the left eye with constellation of findings concerning for intracranial hypertension.  Additionally noted retinal aneurysm discerning for systemic hypertension.  She was sent to the emergency room for urgent MRI/MRV as well as lumbar puncture.                 Systolic blood pressures in the 160s to 170s on presentation.  Chemistries noncontributory.  CBC with platelet count of 436.  WBC count: 12.5.  MRI brain and MRV brain were completed and directly reviewed.  MRI showing papilledema, empty sella, and stenotic bilateral transverse venous sinuses consistent with IIH.  MRV with no evidence of venous sinus thrombosis.  Patient underwent LP with opening pressure of 27.  CSF panel unremarkable.  Patient was communicated with teleneurology with recommendations for Diamox as treatment.  She was started on amlodipine for hypertension.  Was discharged with  recommendations for neurology, ophthalmology, and PCP follow-up.                 Patient describes relatively acute onset blurry vision involving the central aspect of just her left eye which began a few days prior to emergency room presentation.  Since receiving lumbar puncture and starting Diamox, she believes that her symptoms have almost completely resolved.  She describes a prior history of daily chronic headaches, yet since going to the hospital, she says that her headaches are now gone.  Headaches previously described as pressure-like sensation.  Global in distribution.  Started on lisinopril in addition to amlodipine by her PCP this past Thursday.                 She provides the name of Dr. Massie Kluver as her current ophthalmologist.      She was maintained on Diamox 250 mg twice daily with recommendations for ophthalmology follow-up.  I have reviewed her recent ophthalmologic records from Dr. Chong Sicilian, MD (10/13/22).  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  She was also noted to have bilateral swollen optic nerves with venous pulsation concerning for idiopathic intracranial hypertension.  She comes to our clinic as a 39-month follow-up.    ----     She has followed with Dr. Chong Sicilian in January of this year.  I do not have access to these records, but she states she was told aneurysm has improved in size.  She is scheduled to have visual field testing in the near future.  She denies any recurrence of pressure headaches or  blurry vision.  She has been experiencing some tension headaches as seen recently tested positive for influenza a few weeks ago.  She is in the process of weight loss on semaglutide injections         COMPLETE REVIEW OF SYSTEMS NEGATIVE UNLESS STATED ABOVE.    ANY CHANGES TO PAST MEDICAL, SURGICAL, AND FAMILY HISTORIES ARE UNCHANGED AND REVIEWED UNLESS STATED ABOVE.    MEDS:   Current Outpatient Medications   Medication Sig Dispense Refill     acetaZOLAMIDE (DIAMOX) 250 mg tablet Take 1 tablet by mouth 2 times daily. 60 tablet 0    amLODIPine (NORVASC) 5 mg tablet Take 1 tablet by mouth once daily. 30 tablet 0    APPLE CIDER VINEGAR ORAL Take 450 mg by mouth once daily.      cholecalciferol (VITAMIN D3) 25 mcg (1,000 unit) tablet Take 1,000 units by mouth once daily.      ELDERBERRY FRUIT ORAL Take 500 mg by mouth once daily.      lisinopriL (ZESTRIL) 20 mg tablet Take 20 mg by mouth once daily.      quercetin 500 mg Cap Take 1 capsule by mouth once daily.      semaglutide (OZEMPIC) 2 mg/dose (8 mg/3 mL) injection pen Inject 2 mg under the skin every 7 days. Rotate injection sites if injecting into the same area.       No current facility-administered medications for this visit.           PHYSICAL EXAM  BP 132/90 (BP Location: Left arm, Patient Position: Sitting)   Pulse 106   Temp 36.7 ?C (98.1 ?F) (Temporal)   Ht 5' 9 (1.753 m) Comment: patient reported  Wt 274 lb (124.3 kg)   SpO2 95%   BMI 40.46 kg/m?     GENERAL APPEARANCE: Alert, well-developed, well-nourished female in no acute distress.  HEENT: Normocephalic and atraumatic. PERRLA.  Oropharynx unremarkable.  PULM: Normal respiratory effort.  No accessory muscle use.  ABDOMEN: Non-distended    NEURO:  MENTAL STATUS: Patient awake and oriented to time, place, and person.  Affect normal.    CRANIAL NERVES: CN II-XII intact.    MOTOR: Normal bulk, Tone normal and symmetrical throughout. No abnormal movements. No tremor.    Strength 5/5 throughout unless specified below.      Bicep  Tricep  Delt  Grasp  IPsoas Quads Hams DFlex  PFlex  EHL    Left              Right               REFLEXES: Not tested today.  SENSATION: Sensation grossly intact to fine touch  COORDINATION: Intact.        DIAGNOSTIC TESTS.    ASSESSMENT:  Patient Active Problem List   Diagnosis SNOMED CT(R)    Passive smoke exposure PASSIVE SMOKER       [name] was seen today for follow up care of.    Diagnoses and all orders for  this visit:    Idiopathic intracranial hypertension        PLAN:    Idiopathic intracranial hypertension  Prior workup:  MRI brain 10/13/22: With papilledema, empty sella, and stenotic bilateral transverse dural venous sinuses consistent with IIH  MRV 10/13/22: Negative for Dural sinus thrombosis  CSF OP: 27cm H20  Continue Diamox 250 mg twice daily  Extensive time spent educating patient of benefits of weight loss.  Goal of 6 to 10% body weight  loss.  She will follow-up with her PCP for further care.  She is currently on semaglutide injections  She is to maintain close follow-up with her ophthalmology team.  She has been advised that should she experience any progression of vision loss, she is to go to the hospital for immediate assessment  She may follow-up in our neurology clinic in approximately 6 months    Total time spent on today's visit was 30 minutes. I spent 10 minutes face to face with the patient, performance of medically appropriate history and examination as well as education and counseling. I spent 20 minutes in preparation for the visit (I.e., reviewing charts and test results), orders for medications, tests or other procedures, and coordination of care.    **This document was created with the assistance of voice-to-text technology. Effort has been made to minimize transcription errors. Please allow for homonyms and other similar transcription errors, such as is for has, she in place of he and vice versa.**     Delice Bison, DO  [facility]- Neurology (/)  [address]  [place], [place] [address]  Phone #: [phone]  Fax #: [phone]    05/21/23

## 2023-11-22 ENCOUNTER — Ambulatory Visit
Admit: 2023-11-22 | Discharge: 2023-11-22 | Payer: BLUE CROSS/BLUE SHIELD | Attending: Neurology | Primary: Critical Care Medicine

## 2023-11-22 VITALS — BP 142/90 | HR 84 | Temp 98.10000°F | Ht 69.0 in | Wt 272.0 lb

## 2023-11-22 DIAGNOSIS — G932 Benign intracranial hypertension: Principal | ICD-10-CM

## 2023-11-22 NOTE — Progress Notes (Signed)
 NEUROLOGY PROGRESS NOTE    Name: Regina Perez    Date: 11/22/2023    MRN: [id number]    Patient's PCP: Albino Scotts, FNP      REASON FOR VISIT/CHIEF COMPLAINT: Follow-up for idiopathic intracranial hypertension.    HISTORY OF PRESENT ILLNESS: Regina Perez is a 38 y.o. female with past medical history of idiopathic intracranial hypertension who presents to our neurology clinic on 11/22/2023 as a follow-up for idiopathic intracranial hypertension.     Patient was last seen in our neurology clinic on 05/21/2023.  Patient described improvement of symptoms as well as weight loss with semaglutide injections.  She comes to our clinic as a 56-month follow-up.     He has no subjective complaints on today's interview.  Denies any ongoing vision disturbances or headaches.  She remains compliant with Diamox  without side effects.  She is still following with her ophthalmologist with plans for visual field testing later today.  She is also to see her retinal specialist later this month.  She is still taking semaglutide injections for weight loss.  She denies any active pregnancy plans.      COMPLETE REVIEW OF SYSTEMS NEGATIVE UNLESS STATED ABOVE.    ANY CHANGES TO PAST MEDICAL, SURGICAL, AND FAMILY HISTORIES ARE UNCHANGED AND REVIEWED UNLESS STATED ABOVE.    MEDS: Current Medications[1]        PHYSICAL EXAM  BP (!) 142/90 (BP Location: Right arm, Patient Position: Sitting)   Pulse 84   Temp 36.7 ?C (98.1 ?F) (Temporal)   Ht 5' 9 (1.753 m)   Wt 272 lb (123.4 kg)   SpO2 96%   BMI 40.17 kg/m?     GENERAL APPEARANCE: Alert, well-developed, well-nourished female in no acute distress.  HEENT: Normocephalic and atraumatic. PERRLA.  Oropharynx unremarkable.  PULM: Normal respiratory effort.  No accessory muscle use.  ABDOMEN: Non-distended    NEURO:  MENTAL STATUS: Patient awake and oriented to time, place, and person.  Affect normal.    CRANIAL NERVES: CN II-XII intact.    MOTOR: Normal bulk, Tone normal and symmetrical  throughout. No abnormal movements. No tremor.    Strength 5/5 throughout unless specified below.      Bicep  Tricep  Delt  Grasp  IPsoas Quads Hams DFlex  PFlex  EHL    Left              Right                     DIAGNOSTIC TESTS.    ASSESSMENT:  Problem List[2]    Regina Perez was seen today for follow-up.    Diagnoses and all orders for this visit:    IIH (idiopathic intracranial hypertension)        PLAN:    Idiopathic intracranial hypertension  Prior workup:  MRI brain 10/13/22: With papilledema, empty sella, and stenotic bilateral transverse dural venous sinuses consistent with IIH  MRV 10/13/22: Negative for Dural sinus thrombosis  CSF OP: 27cm H20  Continue Diamox  250 mg twice daily  Extensive time spent educating patient of benefits of weight loss.  Goal of 6 to 10% body weight loss.  She will follow-up with her PCP for further care  She is to maintain close follow-up with her ophthalmology team.    She has been advised that should she experience any progression of vision loss, she is to go to the hospital for immediate assessment  She may follow-up in our neurology clinic in approximately 6 months  Total time spent on today's visit was 20 minutes. I spent 10 minutes face to face with the patient, performance of medically appropriate history and examination as well as education and counseling. I spent 10 minutes in preparation for the visit (I.e., reviewing charts and test results), placing orders for medications, tests or other procedures, dictating notes, and coordination of care.    **This document was created with the assistance of voice-to-text technology. Effort has been made to minimize transcription errors. Please allow for homonyms and other similar transcription errors, such as is for has, she in place of he and vice versa.**     Norman Hamilton, DO  [facility]- Neurology ([place])  [address], [address]  [place], [place] [address]  Phone #: [phone]  Fax #: [phone]    11/22/23          [1]   Current Outpatient Medications   Medication Sig Dispense Refill    acetaZOLAMIDE  (DIAMOX ) 250 mg tablet Take 1 tablet by mouth 2 times daily. 60 tablet 0    amLODIPine  (NORVASC ) 5 mg tablet Take 1 tablet by mouth once daily. 30 tablet 0    APPLE CIDER VINEGAR ORAL Take 450 mg by mouth once daily.      cholecalciferol (VITAMIN D3) 25 mcg (1,000 unit) tablet Take 1,000 units by mouth once daily.      ELDERBERRY FRUIT ORAL Take 500 mg by mouth once daily.      lisinopriL (ZESTRIL) 20 mg tablet Take 20 mg by mouth once daily.      quercetin 500 mg Cap Take 1 capsule by mouth once daily.      semaglutide (OZEMPIC) 2 mg/dose (8 mg/3 mL) injection pen Inject 2 mg under the skin every 7 days. Rotate injection sites if injecting into the same area. (Patient taking differently: Inject 1.25 mg under the skin every 7 days. Rotate injection sites if injecting into the same area.)       No current facility-administered medications for this visit.   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

## 2024-01-07 NOTE — Telephone Encounter (Signed)
 The patient states that Dr. Glendia usually prescribes this medication, but a different provider is on the RX from Aug. 2024.     Medication Being Requested:      acetaZOLAMIDE  (DIAMOX ) 250 mg tablet     Pharmacy:    [facility] [id number] - [place], [place] - [address] [place]  [place] [address]  Phone: [phone] Fax: [phone]    How many Days Remaining of medication: 4    Requested Supply: 90 days    Additional Information:     Please call back at:  [phone] when sent.  Okay to leave a detailed message.      **Informed patient that it may take up to 48-72 hours to fill prescription.**    ______________________________    Future Appointments   Date Time Provider Department Center   06/02/2024  8:40 AM Norman Glendia, DO NEUROLOGY [facility]     Last Appointment this Department:  Visit date not found

## 2024-01-07 NOTE — Telephone Encounter (Addendum)
 Requested Prescriptions     Pending Prescriptions Disp Refills    acetaZOLAMIDE  (DIAMOX ) 250 mg tablet 60 tablet 0     Sig: Take 1 tablet by mouth 2 times daily.     LOV:11/22/2023 Norman Hamilton, DO    NOV:06/02/2024 Norman Hamilton, DO    Last Fill: 10/13/22 filled by Leeroy Hash.    Per protocol, ok to fill

## 2024-01-08 MED ORDER — ACETAZOLAMIDE 250 MG TABLET
250 | ORAL_TABLET | Freq: Two times a day (BID) | ORAL | 2 refills | 19.50000 days | Status: AC
Start: 2024-01-08 — End: 2024-04-07
